# Patient Record
Sex: Female | Born: 1980 | Race: White | Hispanic: No | Marital: Married | Smoking: Current every day smoker
Health system: Southern US, Community
[De-identification: ages and names within clinical notes are randomized; demographics above are authoritative.]

## PROBLEM LIST (undated history)

## (undated) DIAGNOSIS — F32A Depression, unspecified: Secondary | ICD-10-CM

## (undated) DIAGNOSIS — F988 Other specified behavioral and emotional disorders with onset usually occurring in childhood and adolescence: Secondary | ICD-10-CM

## (undated) DIAGNOSIS — F329 Major depressive disorder, single episode, unspecified: Secondary | ICD-10-CM

## (undated) DIAGNOSIS — K5790 Diverticulosis of intestine, part unspecified, without perforation or abscess without bleeding: Secondary | ICD-10-CM

## (undated) DIAGNOSIS — N803 Endometriosis of pelvic peritoneum, unspecified: Secondary | ICD-10-CM

## (undated) DIAGNOSIS — M1990 Unspecified osteoarthritis, unspecified site: Secondary | ICD-10-CM

## (undated) DIAGNOSIS — F419 Anxiety disorder, unspecified: Secondary | ICD-10-CM

## (undated) HISTORY — PX: ECTOPIC PREGNANCY SURGERY: SHX613

## (undated) HISTORY — DX: Depression, unspecified: F32.A

## (undated) HISTORY — DX: Anxiety disorder, unspecified: F41.9

## (undated) HISTORY — PX: ABDOMINAL HYSTERECTOMY: SHX81

## (undated) HISTORY — DX: Unspecified osteoarthritis, unspecified site: M19.90

## (undated) HISTORY — PX: URETHRAL SLING: SHX2621

## (undated) HISTORY — DX: Major depressive disorder, single episode, unspecified: F32.9

## (undated) HISTORY — DX: Diverticulosis of intestine, part unspecified, without perforation or abscess without bleeding: K57.90

---

## 2012-01-05 ENCOUNTER — Encounter (HOSPITAL_COMMUNITY): Payer: Self-pay | Admitting: Emergency Medicine

## 2012-01-05 ENCOUNTER — Emergency Department (HOSPITAL_COMMUNITY)
Admission: EM | Admit: 2012-01-05 | Discharge: 2012-01-06 | Disposition: A | Discharge status: Home / Self Care | Payer: PRIVATE HEALTH INSURANCE | Attending: Emergency Medicine | Admitting: Emergency Medicine

## 2012-01-05 DIAGNOSIS — G8918 Other acute postprocedural pain: Secondary | ICD-10-CM | POA: Insufficient documentation

## 2012-01-05 DIAGNOSIS — E119 Type 2 diabetes mellitus without complications: Secondary | ICD-10-CM | POA: Insufficient documentation

## 2012-01-05 DIAGNOSIS — N390 Urinary tract infection, site not specified: Secondary | ICD-10-CM

## 2012-01-05 DIAGNOSIS — R109 Unspecified abdominal pain: Secondary | ICD-10-CM | POA: Insufficient documentation

## 2012-01-05 DIAGNOSIS — F17200 Nicotine dependence, unspecified, uncomplicated: Secondary | ICD-10-CM | POA: Insufficient documentation

## 2012-01-05 DIAGNOSIS — Z79899 Other long term (current) drug therapy: Secondary | ICD-10-CM | POA: Insufficient documentation

## 2012-01-05 LAB — CBC WITH DIFFERENTIAL/PLATELET
Basophils Absolute: 0 10*3/uL (ref 0.0–0.1)
Basophils Relative: 0 % (ref 0–1)
Eosinophils Absolute: 1.1 10*3/uL — ABNORMAL HIGH (ref 0.0–0.7)
Hemoglobin: 12 g/dL (ref 12.0–15.0)
MCHC: 34.8 g/dL (ref 30.0–36.0)
Neutro Abs: 5.5 10*3/uL (ref 1.7–7.7)
Neutrophils Relative %: 62 % (ref 43–77)
Platelets: 303 10*3/uL (ref 150–400)
RDW: 12.4 % (ref 11.5–15.5)

## 2012-01-05 LAB — URINALYSIS, ROUTINE W REFLEX MICROSCOPIC
Ketones, ur: 15 mg/dL — AB
Nitrite: NEGATIVE
Protein, ur: NEGATIVE mg/dL
Urobilinogen, UA: 1 mg/dL (ref 0.0–1.0)
pH: 6 (ref 5.0–8.0)

## 2012-01-05 LAB — COMPREHENSIVE METABOLIC PANEL
AST: 9 U/L (ref 0–37)
Albumin: 3.4 g/dL — ABNORMAL LOW (ref 3.5–5.2)
Alkaline Phosphatase: 51 U/L (ref 39–117)
Chloride: 106 mEq/L (ref 96–112)
Potassium: 3.9 mEq/L (ref 3.5–5.1)
Total Bilirubin: 0.2 mg/dL — ABNORMAL LOW (ref 0.3–1.2)
Total Protein: 7.4 g/dL (ref 6.0–8.3)

## 2012-01-05 LAB — URINE MICROSCOPIC-ADD ON

## 2012-01-05 NOTE — ED Notes (Addendum)
Pt reports having laparoscopy fallopian and endometrium tissue; last week had hysterectomy--reports for last 2-3 days has been getting worse; when eats having nausea/vomiting and reports bowel movements hurt as well; pain to RLQ--reports had f/u today and was told that she had fluid build up in abd or that it could be an infection; pt appears in pain at triage;too pain meds:  last dilaudid 1pm, ibuprofen about 2 hours ago; reports running fever on and off; pt reports swelling to abd

## 2012-01-06 ENCOUNTER — Emergency Department (HOSPITAL_COMMUNITY): Payer: PRIVATE HEALTH INSURANCE

## 2012-01-06 LAB — LIPASE, BLOOD: Lipase: 13 U/L (ref 11–59)

## 2012-01-06 MED ORDER — HYDROMORPHONE HCL PF 1 MG/ML IJ SOLN
1.0000 mg | Freq: Once | INTRAMUSCULAR | Status: AC
  Administered 2012-01-06: 1 mg via INTRAVENOUS
  Filled 2012-01-06: qty 1

## 2012-01-06 MED ORDER — METOCLOPRAMIDE HCL 5 MG/ML IJ SOLN
10.0000 mg | Freq: Once | INTRAMUSCULAR | Status: AC
  Administered 2012-01-06: 10 mg via INTRAVENOUS
  Filled 2012-01-06: qty 2

## 2012-01-06 MED ORDER — IOHEXOL 300 MG/ML  SOLN
100.0000 mL | Freq: Once | INTRAMUSCULAR | Status: AC | PRN
  Administered 2012-01-06: 100 mL via INTRAVENOUS

## 2012-01-06 MED ORDER — SUMATRIPTAN SUCCINATE 6 MG/0.5ML ~~LOC~~ SOLN
6.0000 mg | Freq: Once | SUBCUTANEOUS | Status: AC
  Administered 2012-01-06: 6 mg via SUBCUTANEOUS
  Filled 2012-01-06: qty 0.5

## 2012-01-06 MED ORDER — IOHEXOL 300 MG/ML  SOLN
20.0000 mL | INTRAMUSCULAR | Status: AC
  Administered 2012-01-06: 20 mL via ORAL

## 2012-01-06 MED ORDER — SODIUM CHLORIDE 0.9 % IV BOLUS (SEPSIS)
1000.0000 mL | Freq: Once | INTRAVENOUS | Status: AC
  Administered 2012-01-06: 1000 mL via INTRAVENOUS

## 2012-01-06 MED ORDER — DEXTROSE 5 % IV SOLN
1.0000 g | Freq: Once | INTRAVENOUS | Status: AC
  Administered 2012-01-06: 1 g via INTRAVENOUS
  Filled 2012-01-06: qty 10

## 2012-01-06 MED ORDER — ONDANSETRON HCL 4 MG/2ML IJ SOLN
4.0000 mg | INTRAMUSCULAR | Status: DC | PRN
  Administered 2012-01-06: 4 mg via INTRAVENOUS
  Filled 2012-01-06: qty 2

## 2012-01-06 MED ORDER — KETOROLAC TROMETHAMINE 30 MG/ML IJ SOLN
30.0000 mg | Freq: Once | INTRAMUSCULAR | Status: AC
  Administered 2012-01-06: 30 mg via INTRAVENOUS
  Filled 2012-01-06: qty 1

## 2012-01-06 NOTE — ED Provider Notes (Signed)
History     CSN: [id number]  Arrival date & time 01/05/12  2231   First MD Initiated Contact with Patient 01/06/12 0037      Chief Complaint  Patient presents with  . Abdominal Pain    (Consider location/radiation/quality/duration/timing/severity/associated sxs/prior treatment) HPI Comments: Patient is a 31 year old female with a history of diabetes that presents to the emergency department with severe diffuse abdominal pain status post recent surgery.  3 weeks ago pt had a laparoscopy removing endometrial adhesions, pt was placed on cipro at dc, but has not taken it as directed. Abdominal hysterectomy was performed one week ago in [place] and patient was discharged on Dilaudid and ibuprofen which was relieving her pain until Sat. Patient states that she had her followup appointment today and that the sonographer mentioned she had fluid in her abdomen that might be concerning for infection.  Patient was then dc on Bactrim which was filled, but has not started taking.  Patient has also been suffering from nausea, vomiting, and diarrhea x3 days.  Abdominal pain is rated at 10 out of 10 in this severity without any focal localization. Symptoms are exacerbated by eating. She is unsure she's been febrile, however patient does report chills and night sweats.  Patient denies dysuria, hematuria, urinary frequency, melena, hematochezia, hematemesis, chest pain, pleurisy or shortness of breath.  No other complaints at this time.  Patient is a 31 y.o. female presenting with abdominal pain. The history is provided by the patient.  Abdominal Pain The primary symptoms of the illness include abdominal pain.    Past Medical History  Diagnosis Date  . Diabetes mellitus     borderline    Past Surgical History  Procedure Date  . Cesarean section 1998  . Abdominal hysterectomy   . Ectopic pregnancy surgery     No family history on file.  History  Substance Use Topics  . Smoking status:  Current Everyday Smoker -- 0.5 packs/day  . Smokeless tobacco: Not on file  . Alcohol Use: No    OB History    Grav Para Term Preterm Abortions TAB SAB Ect Mult Living                  Review of Systems  Gastrointestinal: Positive for abdominal pain.  All other systems reviewed and are negative.    Allergies  Sweet potato  Home Medications   Current Outpatient Rx  Name Route Sig Dispense Refill  . CIPROFLOXACIN HCL 500 MG PO TABS Oral Take 500 mg by mouth 2 (two) times daily.    Marland Kitchen CLONAZEPAM 0.5 MG PO TABS Oral Take 0.5 mg by mouth 2 (two) times daily as needed.    Marland Kitchen HYDROMORPHONE HCL 2 MG PO TABS Oral Take 2 mg by mouth every 4 (four) hours as needed. For pain    . IBUPROFEN 600 MG PO TABS Oral Take 600 mg by mouth every 6 (six) hours as needed. For pain    . METFORMIN HCL 500 MG PO TABS Oral Take 500 mg by mouth 2 (two) times daily with a meal.      BP 132/92  Pulse 80  Temp 98.5 F (36.9 C) (Oral)  Resp 19  Ht 5\' 6"  (1.676 m)  Wt 148 lb (67.132 kg)  BMI 23.89 kg/m2  SpO2 98%  Physical Exam  Nursing note and vitals reviewed. Constitutional: She is oriented to person, place, and time. She appears well-developed and well-nourished. No distress.  HENT:  Head: Normocephalic and atraumatic.  Eyes: Conjunctivae and EOM are normal.  Neck: Normal range of motion.  Cardiovascular:       Regular rate rhythm, no aberrancy and auscultation.  Intact distal pulses  Pulmonary/Chest: Effort normal.       Lungs clear to auscultation bilaterally  Abdominal:       Soft nondistended abdomen without peritoneal signs.  Diffuse abdominal tenderness to palpation.  Musculoskeletal: Normal range of motion.  Neurological: She is alert and oriented to person, place, and time.  Skin: Skin is warm and dry. No rash noted. She is not diaphoretic.  Psychiatric: She has a normal mood and affect. Her behavior is normal.    ED Course  Procedures (including critical care time)  Labs  Reviewed  URINALYSIS, ROUTINE W REFLEX MICROSCOPIC - Abnormal; Notable for the following:    APPearance TURBID (*)     Specific Gravity, Urine 1.034 (*)     Hgb urine dipstick LARGE (*)     Bilirubin Urine SMALL (*)     Ketones, ur 15 (*)     Leukocytes, UA MODERATE (*)     All other components within normal limits  CBC WITH DIFFERENTIAL - Abnormal; Notable for the following:    HCT 34.5 (*)     Eosinophils Relative 12 (*)     Eosinophils Absolute 1.1 (*)     All other components within normal limits  COMPREHENSIVE METABOLIC PANEL - Abnormal; Notable for the following:    Albumin 3.4 (*)     Total Bilirubin 0.2 (*)     All other components within normal limits  URINE MICROSCOPIC-ADD ON - Abnormal; Notable for the following:    Squamous Epithelial / LPF MANY (*)     Bacteria, UA MANY (*)     All other components within normal limits  LIPASE, BLOOD   Ct Abdomen Pelvis W Contrast  01/06/2012  *RADIOLOGY REPORT*  Clinical Data: Lower abdominal pain and fever.  CT ABDOMEN AND PELVIS WITH CONTRAST  Technique:  Multidetector CT imaging of the abdomen and pelvis was performed following the standard protocol during bolus administration of intravenous contrast.  Contrast: OMNIPAQUE IOHEXOL 300 MG/ML  SOLN  Comparison: None.  Findings: Dependent atelectasis at the lung bases.  The liver, gallbladder, spleen, pancreas, common bile duct and adrenal glands appear within normal limits.  Gallbladder is distended.  There is no intra-abdominal free air identified.  The stomach appears within normal limits.  Inflammatory changes are present in the anatomic pelvis compatible with recent hysterectomy.  Phlegmon is present without a discrete defined fluid collection.  Urinary bladder appears within normal limits.  Fluid is present in the pouch of [place].  Fluid surrounds mildly inflamed small bowel loops in the pelvis.  There is no extravasation of contrast from bowel.  Normal appendix identified.  Gaseous  distention of the rectum.  There is no bowel obstruction.  No aggressive osseous lesions.  IMPRESSION: Postoperative changes of recent hysterectomy with phlegmon in the anatomic pelvis.  No discrete fluid collection is identified.  No small bowel obstruction.  No intra-abdominal free air.  Original Report Authenticated By: Andreas Newport, M.D.     No diagnosis found.    MDM  Post surgical abdominal pain  Pt presented to ER w post surgical abdominal pain. Labs and imaging reviewed. Urine ? For not clean catch vs UTI. Pt started on Bactrim by provider which should cover. Advised to follow up with PCP if develop urinary symptoms and to keep f-u apt with surgeon.  At this time there does not appear to be any evidence of an acute emergency medical condition and the patient appears stable for discharge with appropriate outpatient follow up        [facility], PA-C 01/06/12 0425

## 2012-01-06 NOTE — ED Provider Notes (Signed)
Medical screening examination/treatment/procedure(s) were performed by non-physician practitioner and as supervising physician I was immediately available for consultation/collaboration.   Millard Bautch M Vicke Plotner, MD 01/06/12 1710 

## 2012-01-06 NOTE — ED Notes (Signed)
Patient transported to CT 

## 2012-12-01 ENCOUNTER — Encounter (HOSPITAL_COMMUNITY): Payer: Self-pay | Admitting: Emergency Medicine

## 2012-12-01 ENCOUNTER — Emergency Department (HOSPITAL_COMMUNITY)
Admission: EM | Admit: 2012-12-01 | Discharge: 2012-12-02 | Disposition: A | Discharge status: Home / Self Care | Payer: PRIVATE HEALTH INSURANCE | Attending: Emergency Medicine | Admitting: Emergency Medicine

## 2012-12-01 DIAGNOSIS — G8929 Other chronic pain: Secondary | ICD-10-CM | POA: Insufficient documentation

## 2012-12-01 DIAGNOSIS — N804 Endometriosis of rectovaginal septum and vagina: Secondary | ICD-10-CM | POA: Insufficient documentation

## 2012-12-01 DIAGNOSIS — N949 Unspecified condition associated with female genital organs and menstrual cycle: Secondary | ICD-10-CM | POA: Insufficient documentation

## 2012-12-01 DIAGNOSIS — F17200 Nicotine dependence, unspecified, uncomplicated: Secondary | ICD-10-CM | POA: Insufficient documentation

## 2012-12-01 DIAGNOSIS — R102 Pelvic and perineal pain: Secondary | ICD-10-CM

## 2012-12-01 DIAGNOSIS — N809 Endometriosis, unspecified: Secondary | ICD-10-CM

## 2012-12-01 DIAGNOSIS — M545 Low back pain, unspecified: Secondary | ICD-10-CM | POA: Insufficient documentation

## 2012-12-01 DIAGNOSIS — Z90710 Acquired absence of both cervix and uterus: Secondary | ICD-10-CM | POA: Insufficient documentation

## 2012-12-01 DIAGNOSIS — E119 Type 2 diabetes mellitus without complications: Secondary | ICD-10-CM | POA: Insufficient documentation

## 2012-12-01 DIAGNOSIS — R32 Unspecified urinary incontinence: Secondary | ICD-10-CM | POA: Insufficient documentation

## 2012-12-01 DIAGNOSIS — N8042 Endometriosis of rectovaginal septum with involvement of vagina: Secondary | ICD-10-CM | POA: Insufficient documentation

## 2012-12-01 DIAGNOSIS — Z9889 Other specified postprocedural states: Secondary | ICD-10-CM | POA: Insufficient documentation

## 2012-12-01 DIAGNOSIS — Z79899 Other long term (current) drug therapy: Secondary | ICD-10-CM | POA: Insufficient documentation

## 2012-12-01 HISTORY — DX: Endometriosis of pelvic peritoneum, unspecified: N80.30

## 2012-12-01 HISTORY — DX: Endometriosis of pelvic peritoneum: N80.3

## 2012-12-01 LAB — URINALYSIS, ROUTINE W REFLEX MICROSCOPIC
Bilirubin Urine: NEGATIVE
Nitrite: NEGATIVE
Protein, ur: NEGATIVE mg/dL
Specific Gravity, Urine: >1.03 — ABNORMAL HIGH (ref 1.005–1.030)
Urobilinogen, UA: 0.2 mg/dL (ref 0.0–1.0)

## 2012-12-01 MED ORDER — FENTANYL CITRATE 0.05 MG/ML IJ SOLN
50.0000 ug | Freq: Once | INTRAMUSCULAR | Status: AC
  Administered 2012-12-02: 50 ug via INTRAVENOUS
  Filled 2012-12-01: qty 2

## 2012-12-01 MED ORDER — ONDANSETRON 4 MG PO TBDP
4.0000 mg | ORAL_TABLET | Freq: Once | ORAL | Status: AC
  Administered 2012-12-01: 4 mg via ORAL
  Filled 2012-12-01: qty 1

## 2012-12-01 NOTE — ED Notes (Signed)
Severe lower back pain, abdominal cramping progressively worse.  Nausea and vomiting.   States had small bloody discharge from vagina a couple of days ago.

## 2012-12-02 ENCOUNTER — Emergency Department (HOSPITAL_COMMUNITY): Payer: PRIVATE HEALTH INSURANCE

## 2012-12-02 LAB — COMPREHENSIVE METABOLIC PANEL
ALT: 7 U/L (ref 0–35)
AST: 12 U/L (ref 0–37)
Albumin: 4.1 g/dL (ref 3.5–5.2)
Alkaline Phosphatase: 41 U/L (ref 39–117)
Calcium: 9.1 mg/dL (ref 8.4–10.5)
GFR calc Af Amer: >90 mL/min (ref >90)
Glucose, Bld: 99 mg/dL (ref 70–99)
Potassium: 3.8 mEq/L (ref 3.5–5.1)
Sodium: 139 mEq/L (ref 135–145)
Total Protein: 6.8 g/dL (ref 6.0–8.3)

## 2012-12-02 LAB — CBC WITH DIFFERENTIAL/PLATELET
Basophils Absolute: 0 10*3/uL (ref 0.0–0.1)
Basophils Relative: 0 % (ref 0–1)
Eosinophils Absolute: 0.6 10*3/uL (ref 0.0–0.7)
Eosinophils Relative: 8 % — ABNORMAL HIGH (ref 0–5)
Lymphs Abs: 2.8 10*3/uL (ref 0.7–4.0)
MCH: 31.1 pg (ref 26.0–34.0)
MCV: 88.4 fL (ref 78.0–100.0)
Neutrophils Relative %: 45 % (ref 43–77)
Platelets: 175 10*3/uL (ref 150–400)
RBC: 4.41 MIL/uL (ref 3.87–5.11)
RDW: 12.5 % (ref 11.5–15.5)

## 2012-12-02 MED ORDER — IOHEXOL 300 MG/ML  SOLN
100.0000 mL | Freq: Once | INTRAMUSCULAR | Status: AC | PRN
  Administered 2012-12-02: 100 mL via INTRAVENOUS

## 2012-12-02 MED ORDER — SODIUM CHLORIDE 0.9 % IV SOLN
1000.0000 mL | Freq: Once | INTRAVENOUS | Status: AC
  Administered 2012-12-02: 1000 mL via INTRAVENOUS

## 2012-12-02 MED ORDER — ONDANSETRON HCL 4 MG/2ML IJ SOLN
4.0000 mg | Freq: Once | INTRAMUSCULAR | Status: AC
  Administered 2012-12-02: 4 mg via INTRAVENOUS
  Filled 2012-12-02: qty 2

## 2012-12-02 MED ORDER — METOCLOPRAMIDE HCL 5 MG/ML IJ SOLN
10.0000 mg | Freq: Once | INTRAMUSCULAR | Status: AC
  Administered 2012-12-02: 10 mg via INTRAVENOUS
  Filled 2012-12-02: qty 2

## 2012-12-02 MED ORDER — MORPHINE SULFATE 2 MG/ML IJ SOLN
4.0000 mg | Freq: Once | INTRAMUSCULAR | Status: AC
  Administered 2012-12-02: 4 mg via INTRAVENOUS
  Filled 2012-12-02: qty 2

## 2012-12-02 MED ORDER — HYDROCODONE-ACETAMINOPHEN 5-325 MG PO TABS: ORAL_TABLET | ORAL | Status: DC

## 2012-12-02 MED ORDER — IOHEXOL 300 MG/ML  SOLN
50.0000 mL | Freq: Once | INTRAMUSCULAR | Status: AC | PRN
  Administered 2012-12-02: 50 mL via ORAL

## 2012-12-02 MED ORDER — SODIUM CHLORIDE 0.9 % IV SOLN
1000.0000 mL | INTRAVENOUS | Status: DC
  Administered 2012-12-02: 1000 mL via INTRAVENOUS

## 2012-12-02 MED ORDER — MORPHINE SULFATE 4 MG/ML IJ SOLN
4.0000 mg | Freq: Once | INTRAMUSCULAR | Status: AC
  Administered 2012-12-02: 4 mg via INTRAVENOUS
  Filled 2012-12-02: qty 1

## 2012-12-02 MED ORDER — DIPHENHYDRAMINE HCL 50 MG/ML IJ SOLN
25.0000 mg | Freq: Once | INTRAMUSCULAR | Status: AC
  Administered 2012-12-02: 25 mg via INTRAVENOUS
  Filled 2012-12-02: qty 1

## 2012-12-02 NOTE — ED Notes (Signed)
Patient tearful, states she is so worried because she doesn't know why she is having so much pain.  Spouse sitting with patient

## 2012-12-02 NOTE — ED Notes (Addendum)
Drinking contrast - has completed both bottles.  Requesting something else for pain and asking if she can have phenergan - states zofran has not helped

## 2012-12-02 NOTE — ED Provider Notes (Signed)
History    CSN: [id number] Arrival date & time 12/01/12  2208  First MD Initiated Contact with Patient 12/02/12 0004     Chief Complaint  Patient presents with  . Abdominal Pain   (Consider location/radiation/quality/duration/timing/severity/associated sxs/prior Treatment)  HPI  Patient reports she had a hysterectomy in August 2013 because the heavy bleeding and endometriosis that was diagnosed by laparoscopy. She reports she's been having chronic pain since that time. She also has chronic low back pain. She states she had a postop infection and that is what started her chronic pain. She states now she is having pain in her vagina and she has urinary incontinence especially if she coughs or lifts up something heavy that started about 2 weeks ago. She denies dysuria but states she feels a constant pressure over her bladder. She also describes a stabbing pain in her lower abdomen. She states she has dyspareunia so bad that she vomits from pain after sex. She states she's had a low-grade fever for the past week of 100. She states she has an appointment with her GYN at [facility] in about 2 weeks. She has this appointment because she called because her symptoms were getting worse. She also reports they put her on Depo-Provera in January however it made her migraines worse so her neurologist had her stop taking it.    PCP Dr Bubba Hales in [place], [place] GYN Dr Renard Matter at [facility]  Past Medical History  Diagnosis Date  . Diabetes mellitus     borderline  . Endometriosis of pelvic peritoneum    Past Surgical History  Procedure Laterality Date  . Cesarean section  1998  . Abdominal hysterectomy    . Ectopic pregnancy surgery     No family history on file. History  Substance Use Topics  . Smoking status: Current Every Day Smoker -- 0.50 packs/day  . Smokeless tobacco: Not on file  . 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    Allergies  Sweet potato  Home Medications   Current Outpatient Rx  Name  Route  Sig  Dispense  Refill  . clonazePAM (KLONOPIN) 0.5 MG tablet   Oral   Take 0.5 mg by mouth 2 (two) times daily as needed.         Marland Kitchen ibuprofen (ADVIL,MOTRIN) 600 MG tablet   Oral   Take 600 mg by mouth every 6 (six) hours as needed. For pain         . metFORMIN (GLUCOPHAGE) 500 MG tablet   Oral   Take 500 mg by mouth 2 (two) times daily with a meal.         . ciprofloxacin (CIPRO) 500 MG tablet   Oral   Take 500 mg by mouth 2 (two) times daily.         Marland Kitchen HYDROmorphone (DILAUDID) 2 MG tablet   Oral   Take 2 mg by mouth every 4 (four) hours as needed. For pain          BP 111/78  Pulse 73  Temp(Src) 98.7 F (37.1 C) (Oral)  Resp 18  Ht 5\' 6"  (1.676 m)  Wt 130 lb (58.968 kg)  BMI 20.99 kg/m2  SpO2 98%  Vital signs normal  Physical Exam  Nursing note and vitals reviewed. Constitutional: She is oriented to person, place, and time. She appears well-developed and well-nourished.  Non-toxic appearance. She does not appear ill. No distress.  HENT:  Head: Normocephalic and atraumatic.  Right Ear: External ear normal.  Left Ear: External ear normal.  Nose: Nose normal. No mucosal edema or rhinorrhea.  Mouth/Throat: Oropharynx is clear and moist and mucous membranes are normal. No dental abscesses or edematous.  Eyes: Conjunctivae and EOM are normal. Pupils are equal, round, and reactive to light.  Neck: Normal range of motion and full passive range of motion without pain. Neck supple.  Cardiovascular: Normal rate, regular rhythm and normal heart sounds.  Exam reveals no gallop and no friction rub.   No murmur heard. Pulmonary/Chest: Effort normal and breath sounds normal. No respiratory distress. She has no wheezes. She has no rhonchi. She has no rales. She exhibits no tenderness  and no crepitus.  Abdominal: Soft. Normal appearance and bowel sounds are normal. She exhibits no distension. There is tenderness. There is no rebound and no guarding.    Musculoskeletal: Normal range of motion. She exhibits no edema and no tenderness.  Moves all extremities well.   Neurological: She is alert and oriented to person, place, and time. She has normal strength. No cranial nerve deficit.  Skin: Skin is warm, dry and intact. No rash noted. No erythema. No pallor.  Psychiatric: She has a normal mood and affect. Her speech is normal and behavior is normal. Her mood appears not anxious.    ED Course  Procedures (including critical care time)  Medications  0.9 %  sodium chloride infusion (0 mLs Intravenous Stopped 12/02/12 0256)    Followed by  0.9 %  sodium chloride infusion (1,000 mLs Intravenous New Bag/Given 12/02/12 0306)  ondansetron (ZOFRAN-ODT) disintegrating tablet 4 mg (4 mg Oral Given 12/01/12 2317)  fentaNYL (SUBLIMAZE) injection 50 mcg (50 mcg Intravenous Given 12/02/12 0010)  morphine 4 MG/ML injection 4 mg (4 mg Intravenous Given 12/02/12 0120)  ondansetron (ZOFRAN) injection 4 mg (4 mg Intravenous Given 12/02/12 0117)  iohexol (OMNIPAQUE) 300 MG/ML solution 50 mL (50 mLs Oral Contrast Given 12/02/12 0235)  iohexol (OMNIPAQUE) 300 MG/ML solution 100 mL (100 mLs Intravenous Contrast Given 12/02/12 0336)  metoCLOPramide (REGLAN) injection 10 mg (10 mg Intravenous Given 12/02/12 0306)  diphenhydrAMINE (BENADRYL) injection 25 mg (25 mg Intravenous Given 12/02/12 0309)  morphine 4 MG/ML injection 4 mg (4 mg Intravenous Given 12/02/12 0310)  morphine 2 MG/ML injection 4 mg (4 mg Intravenous Given 12/02/12 0536)   We discussed her CT results. Pt has an appt in 2 weeks at [facility] for same. Will give copy of her CT scan to take with her.   I searched the [facility] and [place] narcotic data base and she has no entry in either.   Results for orders placed during the hospital encounter of 12/01/12    URINALYSIS, ROUTINE W REFLEX MICROSCOPIC      Result Value Range   Color, Urine YELLOW  YELLOW   APPearance CLEAR  CLEAR   Specific Gravity, Urine >1.030 (*) 1.005 - 1.030   pH 6.0  5.0 - 8.0   Glucose, UA NEGATIVE  NEGATIVE mg/dL   Hgb urine dipstick NEGATIVE  NEGATIVE   Bilirubin Urine NEGATIVE  NEGATIVE   Ketones, ur TRACE (*) NEGATIVE mg/dL   Protein, ur NEGATIVE  NEGATIVE mg/dL   Urobilinogen, UA 0.2  0.0 - 1.0 mg/dL   Nitrite NEGATIVE  NEGATIVE  Leukocytes, UA NEGATIVE  NEGATIVE  CBC WITH DIFFERENTIAL      Result Value Range   WBC 6.8  4.0 - 10.5 K/uL   RBC 4.41  3.87 - 5.11 MIL/uL   Hemoglobin 13.7  12.0 - 15.0 g/dL   HCT 16.1  09.6 - 04.5 %   MCV 88.4  78.0 - 100.0 fL   MCH 31.1  26.0 - 34.0 pg   MCHC 35.1  30.0 - 36.0 g/dL   RDW 40.9  81.1 - 91.4 %   Platelets 175  150 - 400 K/uL   Neutrophils Relative % 45  43 - 77 %   Neutro Abs 3.0  1.7 - 7.7 K/uL   Lymphocytes Relative 41  12 - 46 %   Lymphs Abs 2.8  0.7 - 4.0 K/uL   Monocytes Relative 6  3 - 12 %   Monocytes Absolute 0.4  0.1 - 1.0 K/uL   Eosinophils Relative 8 (*) 0 - 5 %   Eosinophils Absolute 0.6  0.0 - 0.7 K/uL   Basophils Relative 0  0 - 1 %   Basophils Absolute 0.0  0.0 - 0.1 K/uL  COMPREHENSIVE METABOLIC PANEL      Result Value Range   Sodium 139  135 - 145 mEq/L   Potassium 3.8  3.5 - 5.1 mEq/L   Chloride 106  96 - 112 mEq/L   CO2 26  19 - 32 mEq/L   Glucose, Bld 99  70 - 99 mg/dL   BUN 14  6 - 23 mg/dL   Creatinine, Ser 7.82  0.50 - 1.10 mg/dL   Calcium 9.1  8.4 - 95.6 mg/dL   Total Protein 6.8  6.0 - 8.3 g/dL   Albumin 4.1  3.5 - 5.2 g/dL   AST 12  0 - 37 U/L   ALT 7  0 - 35 U/L   Alkaline Phosphatase 41  39 - 117 U/L   Total Bilirubin 0.3  0.3 - 1.2 mg/dL   GFR calc non Af Amer >90  >90 mL/min   GFR calc Af Amer >90  >90 mL/min  LIPASE, BLOOD      Result Value Range   Lipase 31  11 - 59 U/L   Ct Abdomen Pelvis W Contrast  12/02/2012   *RADIOLOGY REPORT*  Clinical Data: Lower  abdominal pain since a hysterectomy in August. Now with worsening pain and fever.  CT ABDOMEN AND PELVIS WITH CONTRAST  Technique:  Multidetector CT imaging of the abdomen and pelvis was performed following the standard protocol during bolus administration of intravenous contrast.  Contrast: 50mL OMNIPAQUE IOHEXOL 300 MG/ML  SOLN, OMNIPAQUE IOHEXOL 300 MG/ML  SOLN  Comparison: 01/06/2012  Findings: Mild dependent atelectasis in the lung bases.  The liver, spleen, gallbladder, pancreas, adrenal glands, kidneys, abdominal aorta, and retroperitoneal lymph nodes are unremarkable. The stomach and small bowel are not abnormally distended.  No evidence of wall thickening.  Contrast material flows to the colon without evidence of obstruction.  Stool filled colon without distension.  No free air or free fluid in the abdomen.  Abdominal wall musculature appears intact.  Pelvis:  Previously demonstrated inflammatory changes in the pelvis have resolved.  No significant free or loculated fluid collection demonstrated.  The uterus is surgically absent. Mild soft tissue prominence in the low pelvis is likely postoperative scarring. Right ovary is visualized and is not abnormally enlarged.  No abnormal adnexal mass on the left.  No evidence of diverticulitis. No significant  lymphadenopathy in the pelvis.  Normal alignment of the lumbar spine.  IMPRESSION: No acute inflammatory process demonstrated in the abdomen or pelvis.  Previous inflammatory changes in the pelvis have resolved.   Original Report Authenticated By: Burman Nieves, M.D.      1. Chronic pelvic pain in female   2. Endometriosis   3. Urinary incontinence     New Prescriptions   HYDROCODONE-ACETAMINOPHEN (NORCO/VICODIN) 5-325 MG PER TABLET    Take 1 or 2 po Q 6hrs for pain    Plan discharge   Devoria Albe, MD, FACEP   MDM    Ward Givens, MD 12/02/12 [phone]

## 2013-12-06 ENCOUNTER — Emergency Department (HOSPITAL_COMMUNITY)
Admission: EM | Admit: 2013-12-06 | Discharge: 2013-12-06 | Disposition: A | Discharge status: Home / Self Care | Payer: PRIVATE HEALTH INSURANCE | Attending: Emergency Medicine | Admitting: Emergency Medicine

## 2013-12-06 ENCOUNTER — Emergency Department (HOSPITAL_COMMUNITY): Payer: PRIVATE HEALTH INSURANCE

## 2013-12-06 ENCOUNTER — Encounter (HOSPITAL_COMMUNITY): Payer: Self-pay | Admitting: Emergency Medicine

## 2013-12-06 DIAGNOSIS — E119 Type 2 diabetes mellitus without complications: Secondary | ICD-10-CM | POA: Insufficient documentation

## 2013-12-06 DIAGNOSIS — F17200 Nicotine dependence, unspecified, uncomplicated: Secondary | ICD-10-CM | POA: Insufficient documentation

## 2013-12-06 DIAGNOSIS — Z8742 Personal history of other diseases of the female genital tract: Secondary | ICD-10-CM | POA: Insufficient documentation

## 2013-12-06 DIAGNOSIS — Z79899 Other long term (current) drug therapy: Secondary | ICD-10-CM | POA: Insufficient documentation

## 2013-12-06 DIAGNOSIS — R5383 Other fatigue: Secondary | ICD-10-CM

## 2013-12-06 DIAGNOSIS — R5381 Other malaise: Secondary | ICD-10-CM | POA: Insufficient documentation

## 2013-12-06 DIAGNOSIS — G43809 Other migraine, not intractable, without status migrainosus: Secondary | ICD-10-CM

## 2013-12-06 LAB — COMPREHENSIVE METABOLIC PANEL
ALT: 7 U/L (ref 0–35)
AST: 13 U/L (ref 0–37)
Albumin: 4.1 g/dL (ref 3.5–5.2)
Alkaline Phosphatase: 41 U/L (ref 39–117)
Anion gap: 14 (ref 5–15)
BUN: 8 mg/dL (ref 6–23)
CHLORIDE: 103 meq/L (ref 96–112)
CO2: 23 meq/L (ref 19–32)
CREATININE: 0.65 mg/dL (ref 0.50–1.10)
Calcium: 9.1 mg/dL (ref 8.4–10.5)
GLUCOSE: 107 mg/dL — AB (ref 70–99)
Potassium: 4.2 mEq/L (ref 3.7–5.3)
Sodium: 140 mEq/L (ref 137–147)
Total Protein: 7.1 g/dL (ref 6.0–8.3)

## 2013-12-06 LAB — CBC WITH DIFFERENTIAL/PLATELET
BASOS ABS: 0 10*3/uL (ref 0.0–0.1)
Basophils Relative: 0 % (ref 0–1)
Eosinophils Absolute: 0.2 10*3/uL (ref 0.0–0.7)
Eosinophils Relative: 3 % (ref 0–5)
HEMATOCRIT: 39.3 % (ref 36.0–46.0)
HEMOGLOBIN: 13.5 g/dL (ref 12.0–15.0)
LYMPHS ABS: 1 10*3/uL (ref 0.7–4.0)
LYMPHS PCT: 13 % (ref 12–46)
MCH: 31 pg (ref 26.0–34.0)
MCHC: 34.4 g/dL (ref 30.0–36.0)
MCV: 90.1 fL (ref 78.0–100.0)
MONO ABS: 0.1 10*3/uL (ref 0.1–1.0)
MONOS PCT: 2 % — AB (ref 3–12)
NEUTROS ABS: 6.9 10*3/uL (ref 1.7–7.7)
Neutrophils Relative %: 82 % — ABNORMAL HIGH (ref 43–77)
Platelets: 208 10*3/uL (ref 150–400)
RBC: 4.36 MIL/uL (ref 3.87–5.11)
RDW: 12.7 % (ref 11.5–15.5)
WBC: 8.3 10*3/uL (ref 4.0–10.5)

## 2013-12-06 MED ORDER — KETOROLAC TROMETHAMINE 30 MG/ML IJ SOLN
30.0000 mg | Freq: Once | INTRAMUSCULAR | Status: AC
  Administered 2013-12-06: 30 mg via INTRAVENOUS
  Filled 2013-12-06: qty 1

## 2013-12-06 MED ORDER — SODIUM CHLORIDE 0.9 % IV BOLUS (SEPSIS)
1000.0000 mL | Freq: Once | INTRAVENOUS | Status: AC
  Administered 2013-12-06: 1000 mL via INTRAVENOUS

## 2013-12-06 MED ORDER — DIPHENHYDRAMINE HCL 50 MG/ML IJ SOLN
25.0000 mg | Freq: Once | INTRAMUSCULAR | Status: AC
  Administered 2013-12-06: 25 mg via INTRAVENOUS
  Filled 2013-12-06: qty 1

## 2013-12-06 MED ORDER — DEXAMETHASONE SODIUM PHOSPHATE 10 MG/ML IJ SOLN
10.0000 mg | Freq: Once | INTRAMUSCULAR | Status: AC
  Administered 2013-12-06: 10 mg via INTRAVENOUS
  Filled 2013-12-06: qty 1

## 2013-12-06 MED ORDER — METOCLOPRAMIDE HCL 5 MG/ML IJ SOLN
10.0000 mg | Freq: Once | INTRAMUSCULAR | Status: AC
  Administered 2013-12-06: 10 mg via INTRAVENOUS
  Filled 2013-12-06: qty 2

## 2013-12-06 NOTE — ED Notes (Signed)
Pt states that she fell approximately 3 weeks ago and hit her left shoulder and head.  Pt states that since she has been having period of dizziness, nausea and headaches.  Pt states this is her first time being checked for the first fall.  Pt states she has been bumping into things and has bruises on her legs.

## 2013-12-06 NOTE — ED Provider Notes (Signed)
CSN: [id number]     Arrival date & time 12/06/13  0118 History   First MD Initiated Contact with Patient 12/06/13 [phone]     Chief Complaint  Patient presents with  . Fall  . Headache     (Consider location/radiation/quality/duration/timing/severity/associated sxs/prior Treatment) HPI Comments: Patient is a 33 year old female with history of migraine headaches, type 2 diabetes. She presents with complaints of headache, unsteadiness, and generalized malaise for the past several days. She denies any visual disturbances. She denies any fevers, chills, or neck pain.  Patient is a 33 y.o. female presenting with fall and headaches. The history is provided by the patient.  Fall This is a recurrent problem. The problem occurs constantly. The problem has not changed since onset.Associated symptoms include headaches.  Headache   Past Medical History  Diagnosis Date  . Diabetes mellitus     borderline  . Endometriosis of pelvic peritoneum    Past Surgical History  Procedure Laterality Date  . Cesarean section  1998  . Abdominal hysterectomy    . Ectopic pregnancy surgery     No family history on file. History  Substance Use Topics  . Smoking status: Current Every Day Smoker -- 0.50 packs/day    Types: Cigarettes  . Smokeless tobacco: Not on file  . Alcohol Use: No   OB History   Grav Para Term Preterm Abortions TAB SAB Ect Mult Living                 Review of Systems  Neurological: Positive for headaches.  All other systems reviewed and are negative.     Allergies  Sweet potato  Home Medications   Prior to Admission medications   Medication Sig Start Date End Date Taking? Authorizing Provider  amphetamine-dextroamphetamine (ADDERALL) 30 MG tablet Take 30 mg by mouth 2 (two) times daily as needed (ADD).   Yes Historical Provider, MD  Aspirin-Salicylamide-Caffeine (BC HEADACHE POWDER PO) Take 1 packet by mouth 4 (four) times daily as needed (pain).   Yes Historical  Provider, MD  butalbital-acetaminophen-caffeine (FIORICET WITH CODEINE) 50-325-40-30 MG per capsule Take 1 capsule by mouth every 4 (four) hours as needed for headache or migraine.   Yes Historical Provider, MD  clonazePAM (KLONOPIN) 0.5 MG tablet Take 0.5 mg by mouth 2 (two) times daily as needed for anxiety.    Yes Historical Provider, MD  metFORMIN (GLUCOPHAGE) 500 MG tablet Take 1,000 mg by mouth at bedtime.    Yes Historical Provider, MD  naproxen sodium (ANAPROX) 220 MG tablet Take 660-880 mg by mouth 4 (four) times daily as needed (pain).   Yes Historical Provider, MD  nortriptyline (PAMELOR) 25 MG capsule Take 25 mg by mouth at bedtime.   Yes Historical Provider, MD  pramipexole (MIRAPEX) 0.125 MG tablet Take 0.25 mg by mouth at bedtime.   Yes Historical Provider, MD  rizatriptan (MAXALT-MLT) 10 MG disintegrating tablet Take 10 mg by mouth every 2 (two) hours as needed for migraine. May repeat in 2 hours if needed   Yes Historical Provider, MD  verapamil (CALAN-SR) 120 MG CR tablet Take 120 mg by mouth at bedtime.   Yes Historical Provider, MD   BP 114/76  Pulse 97  Temp(Src) 98.1 F (36.7 C) (Oral)  Resp 16  Ht 5\' 6"  (1.676 m)  Wt 130 lb 1.6 oz (59.013 kg)  BMI 21.01 kg/m2  SpO2 98% Physical Exam  Nursing note and vitals reviewed. Constitutional: She is oriented to person, place, and time. She appears well-developed  and well-nourished. No distress.  HENT:  Head: Normocephalic and atraumatic.  Mouth/Throat: Oropharynx is clear and moist.  Eyes: EOM are normal. Pupils are equal, round, and reactive to light.  There is no papilledema on funduscopic exam  Neck: Normal range of motion. Neck supple.  Cardiovascular: Normal rate and regular rhythm.  Exam reveals no gallop and no friction rub.   No murmur heard. Pulmonary/Chest: Effort normal and breath sounds normal. No respiratory distress. She has no wheezes.  Abdominal: Soft. Bowel sounds are normal. She exhibits no distension.  There is no tenderness.  Musculoskeletal: Normal range of motion. She exhibits no edema.  Neurological: She is alert and oriented to person, place, and time. No cranial nerve deficit. She exhibits normal muscle tone. Coordination normal.  Skin: Skin is warm and dry. She is not diaphoretic.    ED Course  Procedures (including critical care time) Labs Review Labs Reviewed  CBC WITH DIFFERENTIAL - Abnormal; Notable for the following:    Neutrophils Relative % 82 (*)    Monocytes Relative 2 (*)    All other components within normal limits  COMPREHENSIVE METABOLIC PANEL - Abnormal; Notable for the following:    Glucose, Bld 107 (*)    Total Bilirubin <0.2 (*)    All other components within normal limits    Imaging Review Ct Head Wo Contrast  12/06/2013   CLINICAL DATA:  Fall.  Headache.  EXAM: CT HEAD WITHOUT CONTRAST  TECHNIQUE: Contiguous axial images were obtained from the base of the skull through the vertex without intravenous contrast.  COMPARISON:  None.  FINDINGS: Skull and Sinuses:Negative for fracture or destructive process. Mild inflammatory mucosal thickening in the bilateral ethmoid sinuses.  Orbits: No acute abnormality.  Brain: No evidence of acute abnormality, such as acute infarction, hemorrhage, hydrocephalus, or mass lesion/mass effect.  IMPRESSION: Negative head CT.   Electronically Signed   By: Tiburcio PeaJonathan  Watts M.D.   On: 12/06/2013 02:23     EKG Interpretation None      MDM   Final diagnoses:  None    Patient feeling better with IV fluids and migraine cocktail. CT of the head is unremarkable and laboratory studies are normal. She is to followup with her primary doctor if not improving in the next several days.    Geoffery Lyonsouglas Mallie Giambra, MD 12/06/13 [phone]

## 2013-12-06 NOTE — Discharge Instructions (Signed)
Return to the emergency department if your symptoms substantially worsen or change. Follow up with your primary doctor in the next several days.   Migraine Headache A migraine headache is an intense, throbbing pain on one or both sides of your head. A migraine can last for 30 minutes to several hours. CAUSES  The exact cause of a migraine headache is not always known. However, a migraine may be caused when nerves in the brain become irritated and release chemicals that cause inflammation. This causes pain. Certain things may also trigger migraines, such as:  Alcohol.  Smoking.  Stress.  Menstruation.  Aged cheeses.  Foods or drinks that contain nitrates, glutamate, aspartame, or tyramine.  Lack of sleep.  Chocolate.  Caffeine.  Hunger.  Physical exertion.  Fatigue.  Medicines used to treat chest pain (nitroglycerine), birth control pills, estrogen, and some blood pressure medicines. SIGNS AND SYMPTOMS  Pain on one or both sides of your head.  Pulsating or throbbing pain.  Severe pain that prevents daily activities.  Pain that is aggravated by any physical activity.  Nausea, vomiting, or both.  Dizziness.  Pain with exposure to bright lights, loud noises, or activity.  General sensitivity to bright lights, loud noises, or smells. Before you get a migraine, you may get warning signs that a migraine is coming (aura). An aura may include:  Seeing flashing lights.  Seeing bright spots, halos, or zig-zag lines.  Having tunnel vision or blurred vision.  Having feelings of numbness or tingling.  Having trouble talking.  Having muscle weakness. DIAGNOSIS  A migraine headache is often diagnosed based on:  Symptoms.  Physical exam.  A CT scan or MRI of your head. These imaging tests cannot diagnose migraines, but they can help rule out other causes of headaches. TREATMENT Medicines may be given for pain and nausea. Medicines can also be given to help  prevent recurrent migraines.  HOME CARE INSTRUCTIONS  Only take over-the-counter or prescription medicines for pain or discomfort as directed by your health care provider. The use of long-term narcotics is not recommended.  Lie down in a dark, quiet room when you have a migraine.  Keep a journal to find out what may trigger your migraine headaches. For example, write down:  What you eat and drink.  How much sleep you get.  Any change to your diet or medicines.  Limit alcohol consumption.  Quit smoking if you smoke.  Get 7-9 hours of sleep, or as recommended by your health care provider.  Limit stress.  Keep lights dim if bright lights bother you and make your migraines worse. SEEK IMMEDIATE MEDICAL CARE IF:   Your migraine becomes severe.  You have a fever.  You have a stiff neck.  You have vision loss.  You have muscular weakness or loss of muscle control.  You start losing your balance or have trouble walking.  You feel faint or pass out.  You have severe symptoms that are different from your first symptoms. MAKE SURE YOU:   Understand these instructions.  Will watch your condition.  Will get help right away if you are not doing well or get worse. Document Released: 05/12/2005 Document Revised: 03/02/2013 Document Reviewed: 01/17/2013 [facility] Patient Information 2015 [place], [place]. This information is not intended to replace advice given to you by your health care provider. Make sure you discuss any questions you have with your health care provider.

## 2014-05-27 ENCOUNTER — Emergency Department (HOSPITAL_COMMUNITY): Payer: PRIVATE HEALTH INSURANCE

## 2014-05-27 ENCOUNTER — Encounter (HOSPITAL_COMMUNITY): Payer: Self-pay | Admitting: Emergency Medicine

## 2014-05-27 ENCOUNTER — Emergency Department (HOSPITAL_COMMUNITY)
Admission: EM | Admit: 2014-05-27 | Discharge: 2014-05-27 | Disposition: A | Discharge status: Home / Self Care | Payer: PRIVATE HEALTH INSURANCE | Attending: Emergency Medicine | Admitting: Emergency Medicine

## 2014-05-27 DIAGNOSIS — S0990XA Unspecified injury of head, initial encounter: Secondary | ICD-10-CM | POA: Diagnosis not present

## 2014-05-27 DIAGNOSIS — Z8742 Personal history of other diseases of the female genital tract: Secondary | ICD-10-CM | POA: Diagnosis not present

## 2014-05-27 DIAGNOSIS — Y9289 Other specified places as the place of occurrence of the external cause: Secondary | ICD-10-CM | POA: Diagnosis not present

## 2014-05-27 DIAGNOSIS — F909 Attention-deficit hyperactivity disorder, unspecified type: Secondary | ICD-10-CM | POA: Diagnosis not present

## 2014-05-27 DIAGNOSIS — Z7982 Long term (current) use of aspirin: Secondary | ICD-10-CM | POA: Diagnosis not present

## 2014-05-27 DIAGNOSIS — S060X9A Concussion with loss of consciousness of unspecified duration, initial encounter: Secondary | ICD-10-CM

## 2014-05-27 DIAGNOSIS — Z720 Tobacco use: Secondary | ICD-10-CM | POA: Diagnosis not present

## 2014-05-27 DIAGNOSIS — Y998 Other external cause status: Secondary | ICD-10-CM | POA: Insufficient documentation

## 2014-05-27 DIAGNOSIS — S060X0A Concussion without loss of consciousness, initial encounter: Secondary | ICD-10-CM | POA: Diagnosis not present

## 2014-05-27 DIAGNOSIS — S20212A Contusion of left front wall of thorax, initial encounter: Secondary | ICD-10-CM | POA: Diagnosis not present

## 2014-05-27 DIAGNOSIS — E119 Type 2 diabetes mellitus without complications: Secondary | ICD-10-CM | POA: Insufficient documentation

## 2014-05-27 DIAGNOSIS — Y9389 Activity, other specified: Secondary | ICD-10-CM | POA: Diagnosis not present

## 2014-05-27 DIAGNOSIS — W01198A Fall on same level from slipping, tripping and stumbling with subsequent striking against other object, initial encounter: Secondary | ICD-10-CM | POA: Insufficient documentation

## 2014-05-27 DIAGNOSIS — S0291XA Unspecified fracture of skull, initial encounter for closed fracture: Secondary | ICD-10-CM | POA: Insufficient documentation

## 2014-05-27 DIAGNOSIS — S40012A Contusion of left shoulder, initial encounter: Secondary | ICD-10-CM | POA: Diagnosis not present

## 2014-05-27 DIAGNOSIS — Z79899 Other long term (current) drug therapy: Secondary | ICD-10-CM | POA: Insufficient documentation

## 2014-05-27 DIAGNOSIS — S060XAA Concussion with loss of consciousness status unknown, initial encounter: Secondary | ICD-10-CM

## 2014-05-27 HISTORY — DX: Other specified behavioral and emotional disorders with onset usually occurring in childhood and adolescence: F98.8

## 2014-05-27 LAB — CBG MONITORING, ED: GLUCOSE-CAPILLARY: 99 mg/dL (ref 70–99)

## 2014-05-27 MED ORDER — HYDROCODONE-ACETAMINOPHEN 5-325 MG PO TABS: 1.0000 | ORAL_TABLET | Freq: Four times a day (QID) | ORAL | Status: AC | PRN

## 2014-05-27 MED ORDER — HYDROMORPHONE HCL 1 MG/ML IJ SOLN
1.0000 mg | Freq: Once | INTRAMUSCULAR | Status: AC
  Administered 2014-05-27: 1 mg via INTRAVENOUS
  Filled 2014-05-27: qty 1

## 2014-05-27 MED ORDER — SODIUM CHLORIDE 0.9 % IV BOLUS (SEPSIS)
1000.0000 mL | Freq: Once | INTRAVENOUS | Status: AC
  Administered 2014-05-27: 1000 mL via INTRAVENOUS

## 2014-05-27 MED ORDER — PROMETHAZINE HCL 25 MG/ML IJ SOLN
12.5000 mg | Freq: Once | INTRAMUSCULAR | Status: AC
  Administered 2014-05-27: 12.5 mg via INTRAVENOUS
  Filled 2014-05-27: qty 1

## 2014-05-27 MED ORDER — ONDANSETRON HCL 4 MG/2ML IJ SOLN
4.0000 mg | Freq: Once | INTRAMUSCULAR | Status: AC
  Administered 2014-05-27: 4 mg via INTRAVENOUS
  Filled 2014-05-27: qty 2

## 2014-05-27 MED ORDER — DIPHENHYDRAMINE HCL 50 MG/ML IJ SOLN
25.0000 mg | Freq: Once | INTRAMUSCULAR | Status: AC
  Administered 2014-05-27: 25 mg via INTRAVENOUS
  Filled 2014-05-27: qty 1

## 2014-05-27 MED ORDER — ONDANSETRON 4 MG PO TBDP
4.0000 mg | ORAL_TABLET | Freq: Three times a day (TID) | ORAL | Status: AC | PRN
Start: 2014-05-27 — End: ?

## 2014-05-27 MED ORDER — DEXAMETHASONE SODIUM PHOSPHATE 10 MG/ML IJ SOLN
10.0000 mg | Freq: Once | INTRAMUSCULAR | Status: AC
  Administered 2014-05-27: 10 mg via INTRAVENOUS
  Filled 2014-05-27: qty 1

## 2014-05-27 MED ORDER — SODIUM CHLORIDE 0.9 % IV SOLN
INTRAVENOUS | Status: DC
  Administered 2014-05-27: 100 mL/h via INTRAVENOUS

## 2014-05-27 NOTE — ED Notes (Signed)
Pt. reports migraine headache for several days with photophobia , nausea and mild dizziness.

## 2014-05-27 NOTE — Discharge Instructions (Signed)
Concussion A concussion, or closed-head injury, is a brain injury caused by a direct blow to the head or by a quick and sudden movement (jolt) of the head or neck. Concussions are usually not life-threatening. Even so, the effects of a concussion can be serious. If you have had a concussion before, you are more likely to experience concussion-like symptoms after a direct blow to the head.  CAUSES  Direct blow to the head, such as from running into another player during a soccer game, being hit in a fight, or hitting your head on a hard surface.  A jolt of the head or neck that causes the brain to move back and forth inside the skull, such as in a car crash. SIGNS AND SYMPTOMS The signs of a concussion can be hard to notice. Early on, they may be missed by you, family members, and health care providers. You may look fine but act or feel differently. Symptoms are usually temporary, but they may last for days, weeks, or even longer. Some symptoms may appear right away while others may not show up for hours or days. Every head injury is different. Symptoms include:  Mild to moderate headaches that will not go away.  A feeling of pressure inside your head.  Having more trouble than usual:  Learning or remembering things you have heard.  Answering questions.  Paying attention or concentrating.  Organizing daily tasks.  Making decisions and solving problems.  Slowness in thinking, acting or reacting, speaking, or reading.  Getting lost or being easily confused.  Feeling tired all the time or lacking energy (fatigued).  Feeling drowsy.  Sleep disturbances.  Sleeping more than usual.  Sleeping less than usual.  Trouble falling asleep.  Trouble sleeping (insomnia).  Loss of balance or feeling lightheaded or dizzy.  Nausea or vomiting.  Numbness or tingling.  Increased sensitivity to:  Sounds.  Lights.  Distractions.  Vision problems or eyes that tire  easily.  Diminished sense of taste or smell.  Ringing in the ears.  Mood changes such as feeling sad or anxious.  Becoming easily irritated or angry for little or no reason.  Lack of motivation.  Seeing or hearing things other people do not see or hear (hallucinations). DIAGNOSIS Your health care provider can usually diagnose a concussion based on a description of your injury and symptoms. He or she will ask whether you passed out (lost consciousness) and whether you are having trouble remembering events that happened right before and during your injury. Your evaluation might include:  A brain scan to look for signs of injury to the brain. Even if the test shows no injury, you may still have a concussion.  Blood tests to be sure other problems are not present. TREATMENT  Concussions are usually treated in an emergency department, in urgent care, or at a clinic. You may need to stay in the hospital overnight for further treatment.  Tell your health care provider if you are taking any medicines, including prescription medicines, over-the-counter medicines, and natural remedies. Some medicines, such as blood thinners (anticoagulants) and aspirin, may increase the chance of complications. Also tell your health care provider whether you have had alcohol or are taking illegal drugs. This information may affect treatment.  Your health care provider will send you home with important instructions to follow.  How fast you will recover from a concussion depends on many factors. These factors include how severe your concussion is, what part of your brain was injured, your  age, and how healthy you were before the concussion. °· Most people with mild injuries recover fully. Recovery can take time. In general, recovery is slower in older persons. Also, persons who have had a concussion in the past or have other medical problems may find that it takes longer to recover from their current injury. °HOME  CARE INSTRUCTIONS °General Instructions °· Carefully follow the directions your health care provider gave you. °· Only take over-the-counter or prescription medicines for pain, discomfort, or fever as directed by your health care provider. °· Take only those medicines that your health care provider has approved. °· Do not drink alcohol until your health care provider says you are well enough to do so. Alcohol and certain other drugs may slow your recovery and can put you at risk of further injury. °· If it is harder than usual to remember things, write them down. °· If you are easily distracted, try to do one thing at a time. For example, do not try to watch TV while fixing dinner. °· Talk with family members or close friends when making important decisions. °· Keep all follow-up appointments. Repeated evaluation of your symptoms is recommended for your recovery. °· Watch your symptoms and tell others to do the same. Complications sometimes occur after a concussion. Older adults with a brain injury may have a higher risk of serious complications, such as a blood clot on the brain. °· Tell your teachers, school nurse, school counselor, coach, athletic trainer, or work manager about your injury, symptoms, and restrictions. Tell them about what you can or cannot do. They should watch for: °¨ Increased problems with attention or concentration. °¨ Increased difficulty remembering or learning new information. °¨ Increased time needed to complete tasks or assignments. °¨ Increased irritability or decreased ability to cope with stress. °¨ Increased symptoms. °· Rest. Rest helps the brain to heal. Make sure you: °¨ Get plenty of sleep at night. Avoid staying up late at night. °¨ Keep the same bedtime hours on weekends and weekdays. °¨ Rest during the day. Take daytime naps or rest breaks when you feel tired. °· Limit activities that require a lot of thought or concentration. These include: °¨ Doing homework or job-related  work. °¨ Watching TV. °¨ Working on the computer. °· Avoid any situation where there is potential for another head injury (football, hockey, soccer, basketball, martial arts, downhill snow sports and horseback riding). Your condition will get worse every time you experience a concussion. You should avoid these activities until you are evaluated by the appropriate follow-up health care providers. °Returning To Your Regular Activities °You will need to return to your normal activities slowly, not all at once. You must give your body and brain enough time for recovery. °· Do not return to sports or other athletic activities until your health care provider tells you it is safe to do so. °· Ask your health care provider when you can drive, ride a bicycle, or operate heavy machinery. Your ability to react may be slower after a brain injury. Never do these activities if you are dizzy. °· Ask your health care provider about when you can return to work or school. °Preventing Another Concussion °It is very important to avoid another brain injury, especially before you have recovered. In rare cases, another injury can lead to permanent brain damage, brain swelling, or death. The risk of this is greatest during the first 7-10 days after a head injury. Avoid injuries by: °· Wearing a seat   belt when riding in a car.  Drinking alcohol only in moderation.  Wearing a helmet when biking, skiing, skateboarding, skating, or doing similar activities.  Avoiding activities that could lead to a second concussion, such as contact or recreational sports, until your health care provider says it is okay.  Taking safety measures in your home.  Remove clutter and tripping hazards from floors and stairways.  Use grab bars in bathrooms and handrails by stairs.  Place non-slip mats on floors and in bathtubs.  Improve lighting in dim areas. SEEK MEDICAL CARE IF:  You have increased problems paying attention or  concentrating.  You have increased difficulty remembering or learning new information.  You need more time to complete tasks or assignments than before.  You have increased irritability or decreased ability to cope with stress.  You have more symptoms than before. Seek medical care if you have any of the following symptoms for more than 2 weeks after your injury:  Lasting (chronic) headaches.  Dizziness or balance problems.  Nausea.  Vision problems.  Increased sensitivity to noise or light.  Depression or mood swings.  Anxiety or irritability.  Memory problems.  Difficulty concentrating or paying attention.  Sleep problems.  Feeling tired all the time. SEEK IMMEDIATE MEDICAL CARE IF:  You have severe or worsening headaches. These may be a sign of a blood clot in the brain.  You have weakness (even if only in one hand, leg, or part of the face).  You have numbness.  You have decreased coordination.  You vomit repeatedly.  You have increased sleepiness.  One pupil is larger than the other.  You have convulsions.  You have slurred speech.  You have increased confusion. This may be a sign of a blood clot in the brain.  You have increased restlessness, agitation, or irritability.  You are unable to recognize people or places.  You have neck pain.  It is difficult to wake you up.  You have unusual behavior changes.  You lose consciousness. MAKE SURE YOU:  Understand these instructions.  Will watch your condition.  Will get help right away if you are not doing well or get worse. Document Released: 08/02/2003 Document Revised: 05/17/2013 Document Reviewed: 12/02/2012 [facility] Patient Information 2015 [place], [place]. This information is not intended to replace advice given to you by your health care provider. Make sure you discuss any questions you have with your health care provider.  Follow-up with neurosurgery for the concussion in the skull  fracture. Take pain medicine as directed. Takes Zofran as needed for any nausea.

## 2014-05-27 NOTE — ED Notes (Signed)
Pt reports migraine for several days worsening this evening.  Husband states she fell in the bathroom 6 days ago and hit her head, unclear if headache is from the fall or if it is one of her migraines which she has regularly.

## 2014-05-27 NOTE — ED Provider Notes (Signed)
CSN: [id number]     Arrival date & time 05/27/14  0024 History   First MD Initiated Contact with Patient 05/27/14 0130     This chart was scribed for Vanetta Mulders, MD by Arlan Organ, ED Scribe. This patient was seen in room D30C/D30C and the patient's care was started 4:00 AM.   Chief Complaint  Patient presents with  . Migraine   Patient is a 34 y.o. female presenting with migraines. The history is provided by the patient. No language interpreter was used.  Migraine This is a recurrent problem. The current episode started more than 1 week ago. The problem occurs daily. The problem has been gradually worsening. Associated symptoms include chest pain, headaches and shortness of breath. Pertinent negatives include no abdominal pain. The symptoms are aggravated by walking and standing. The symptoms are relieved by rest. She has tried nothing for the symptoms.    HPI Comments: Brenda Roberts is a 34 y.o. female with a PMHx of borderline DM who presents to the Emergency Department complaining of an intermittent R sided and posterior migraine x 1 week that has progressively worsened in last few days. HA described as throbbing and rated 8/10 at this time.  She also reports blurred and double vision along with nausea, vomiting, and photophobia. Symptoms are worsened with standing and ambulation. She admits to a history of Migraines but states this episode feels different. Pt states she experienced some dizziness resulting in a fall in her shower approximately 1 week ago. Pt was not evaluated at time of fall but feels symptoms started after fall. No known allergies to medications.  Past Medical History  Diagnosis Date  . Diabetes mellitus     borderline  . Endometriosis of pelvic peritoneum   . ADD (attention deficit disorder)    Past Surgical History  Procedure Laterality Date  . Cesarean section  1998  . Abdominal hysterectomy    . Ectopic pregnancy surgery     No family history on  file. History  Substance Use Topics  . Smoking status: Current Every Day Smoker -- 0.50 packs/day    Types: Cigarettes  . Smokeless tobacco: Not on file  . Alcohol Use: No   OB History    No data available     Review of Systems  Constitutional: Negative for fever and chills.  HENT: Negative for congestion, rhinorrhea and sore throat.   Eyes: Positive for photophobia and visual disturbance.  Respiratory: Positive for shortness of breath. Negative for cough.   Cardiovascular: Positive for chest pain. Negative for leg swelling.  Gastrointestinal: Positive for nausea and vomiting. Negative for abdominal pain.  Genitourinary: Negative for dysuria.  Musculoskeletal: Negative for back pain.  Skin: Negative for rash.  Neurological: Positive for headaches.  Hematological: Does not bruise/bleed easily.  Psychiatric/Behavioral: Negative for confusion.      Allergies  Sweet potato  Home Medications   Prior to Admission medications   Medication Sig Start Date End Date Taking? Authorizing Provider  amphetamine-dextroamphetamine (ADDERALL) 30 MG tablet Take 30 mg by mouth 2 (two) times daily as needed (ADD).   Yes Historical Provider, MD  Aspirin-Salicylamide-Caffeine (BC HEADACHE POWDER PO) Take 1 packet by mouth 4 (four) times daily as needed (pain).   Yes Historical Provider, MD  butalbital-acetaminophen-caffeine (FIORICET WITH CODEINE) 50-325-40-30 MG per capsule Take 1 capsule by mouth every 4 (four) hours as needed for headache or migraine.   Yes Historical Provider, MD  clonazePAM (KLONOPIN) 0.5 MG tablet Take 0.5 mg by  mouth 2 (two) times daily as needed for anxiety.    Yes Historical Provider, MD  metFORMIN (GLUCOPHAGE) 500 MG tablet Take 1,000 mg by mouth at bedtime.    Yes Historical Provider, MD  naproxen sodium (ANAPROX) 220 MG tablet Take 660-880 mg by mouth 4 (four) times daily as needed (pain).   Yes Historical Provider, MD  nortriptyline (PAMELOR) 25 MG capsule Take 25 mg  by mouth at bedtime.   Yes Historical Provider, MD  pramipexole (MIRAPEX) 0.125 MG tablet Take 0.25 mg by mouth at bedtime.   Yes Historical Provider, MD  promethazine (PHENERGAN) 25 MG tablet Take 25 mg by mouth every 6 (six) hours as needed for nausea or vomiting.   Yes Historical Provider, MD  HYDROcodone-acetaminophen (NORCO/VICODIN) 5-325 MG per tablet Take 1-2 tablets by mouth every 6 (six) hours as needed for moderate pain. 05/27/14   Vanetta Mulders, MD  ondansetron (ZOFRAN ODT) 4 MG disintegrating tablet Take 1 tablet (4 mg total) by mouth every 8 (eight) hours as needed for nausea or vomiting. 05/27/14   Vanetta Mulders, MD  rizatriptan (MAXALT-MLT) 10 MG disintegrating tablet Take 10 mg by mouth every 2 (two) hours as needed for migraine. May repeat in 2 hours if needed    Historical Provider, MD  verapamil (CALAN-SR) 120 MG CR tablet Take 120 mg by mouth at bedtime.    Historical Provider, MD   Triage Vitals: BP 113/75 mmHg  Pulse 81  Temp(Src) 98.3 F (36.8 C) (Oral)  Resp 16  SpO2 100%   Physical Exam  Constitutional: She is oriented to person, place, and time. She appears well-developed and well-nourished. No distress.  HENT:  Head: Normocephalic and atraumatic.  Mouth/Throat: Oropharynx is clear and moist.  Eyes: Conjunctivae and EOM are normal. Pupils are equal, round, and reactive to light.  Sclera clear  Neck: Normal range of motion.  Cardiovascular: Normal rate, regular rhythm and normal heart sounds.   No murmur heard. Pulmonary/Chest: Effort normal and breath sounds normal.  Abdominal: Soft. She exhibits no distension. There is no tenderness.  Musculoskeletal: Normal range of motion. She exhibits no edema.  Neurological: She is alert and oriented to person, place, and time. No cranial nerve deficit. She exhibits normal muscle tone. Coordination normal.  Skin: Skin is warm and dry.  Bruise on top of L shoulder 2 cm in size Small bruise to L anterior rib margin 1 cm in  size  Psychiatric: She has a normal mood and affect. Judgment normal.  Nursing note and vitals reviewed.   ED Course  Procedures (including critical care time)  DIAGNOSTIC STUDIES: Oxygen Saturation is 100% on RA, Normal by my interpretation.    COORDINATION OF CARE: 4:00 AM-Discussed treatment plan with pt at bedside and pt agreed to plan.     Labs Review Labs Reviewed  CBG MONITORING, ED    Imaging Review Ct Head Wo Contrast  05/27/2014   CLINICAL DATA:  Dizzy spells for 1 week. Patient fell last Saturday and on Thursday, striking head to the posterior and right side. Right-sided headache.  EXAM: CT HEAD WITHOUT CONTRAST  TECHNIQUE: Contiguous axial images were obtained from the base of the skull through the vertex without intravenous contrast.  COMPARISON:  12/06/2013  FINDINGS: Ventricles and sulci appear symmetrical. No mass effect or midline shift. No abnormal extra-axial fluid collections. Gray-white matter junctions are distinct. Basal cisterns are not effaced. No evidence of acute intracranial hemorrhage. Linear lucency in the right posterior occipital skull consistent with non  depressed basal skull fracture. Fracture extends to the foramen magnum. Mucosal thickening in the paranasal sinuses with mucous retention cysts in the left maxillary antrum.  IMPRESSION: Nondepressed right posterior occipital skull fracture. No acute intracranial abnormalities. Inflammatory changes in the paranasal sinuses.   Electronically Signed   By: Burman Nieves M.D.   On: 05/27/2014 02:24     EKG Interpretation None      MDM   Final diagnoses:  Head injury  Skull fracture with concussion, closed, initial encounter    Patient status post fall in the shower. Complaint of pain in the right posterior part of the head radiating to the right side. CT scan shows no brain injury but shows a closed skull fracture. Nondepressed. Patient's symptoms consistent with this and probably concussion. Patient  has a history of migraines however this headache was very different than her migraines. Patient nontoxic no acute distress. No significant neuro findings. Patient given follow-up with neurosurgery.  I personally performed the services described in this documentation, which was scribed in my presence. The recorded information has been reviewed and is accurate.    Vanetta Mulders, MD 05/27/14 [phone]

## 2016-03-23 IMAGING — CT CT HEAD W/O CM
1 series · 16 of 30 positions shown, 20 images · non-contrast
Comparison: 12/06/2013

CLINICAL DATA: Dizzy spells for 1 week. Patient fell last [REDACTED]
and on [REDACTED], striking head to the posterior and right side.
Right-sided headache.

EXAM:
CT HEAD WITHOUT CONTRAST
TECHNIQUE: Contiguous axial images were obtained from the base of the skull
through the vertex without intravenous contrast.

[Series 2: head 5.0 h30s · axial · 0.43mm/px · z∈[-146,-1]mm · 16 of 33 slices shown, 20 images]
[im 2/33  brain]
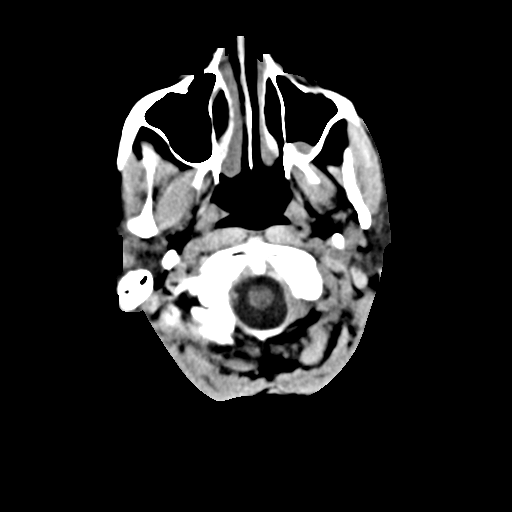
[im 2/33  bone]
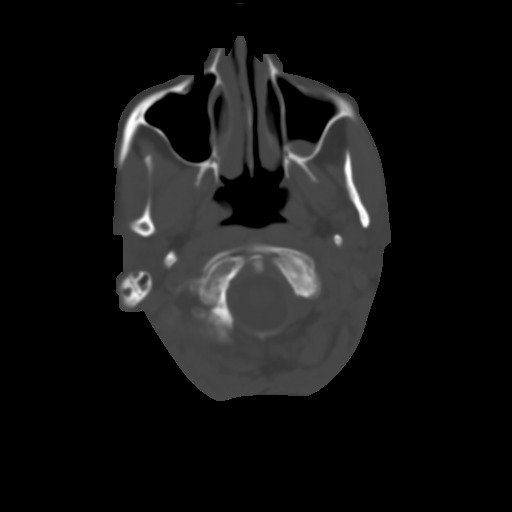
[im 4/33  brain]
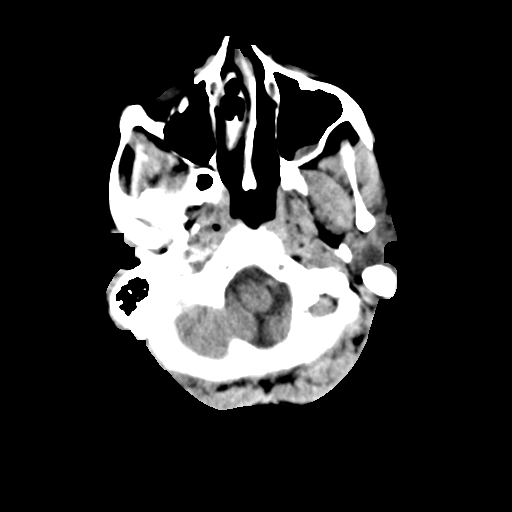
[im 6/33  brain]
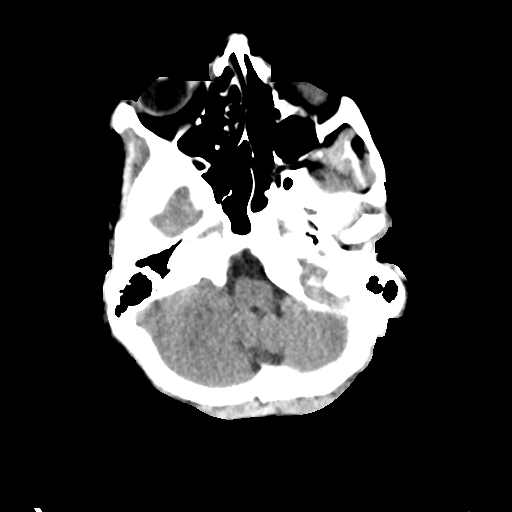
[im 8/33  brain]
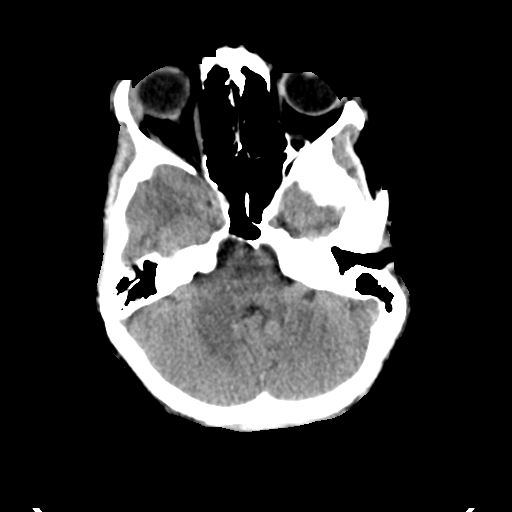
[im 9/33  brain]
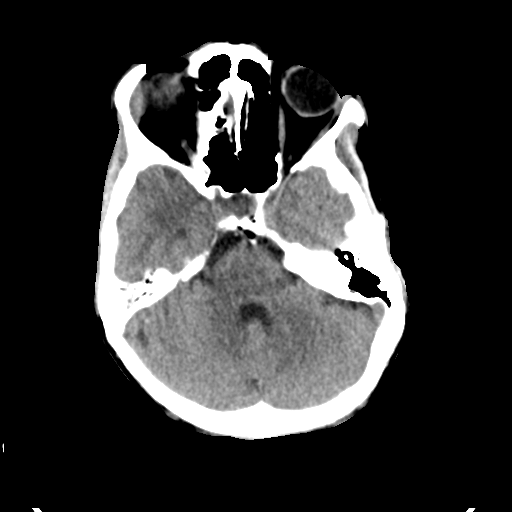
[im 9/33  bone]
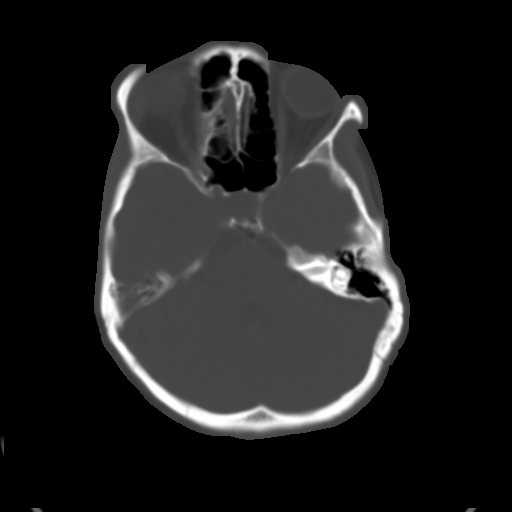
[im 12/33  brain]
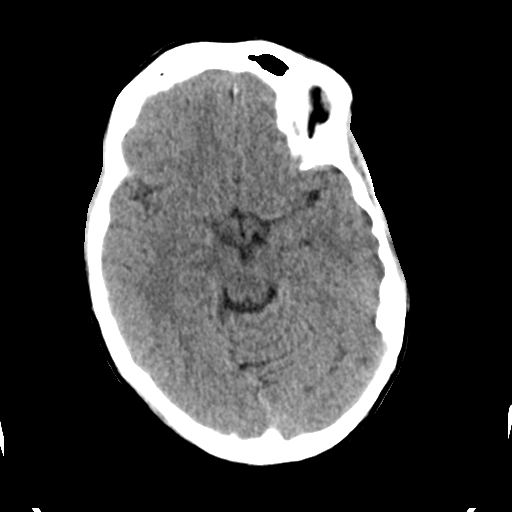
[im 14/33  brain]
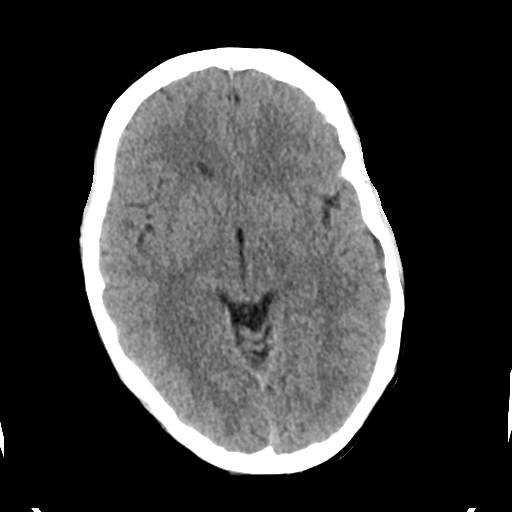
[im 16/33  brain]
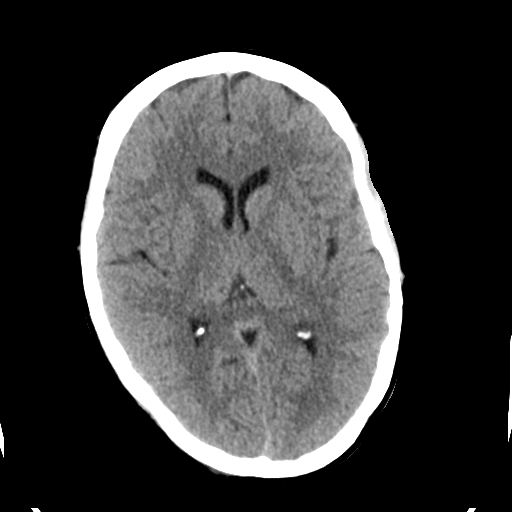
[im 17/33  brain]
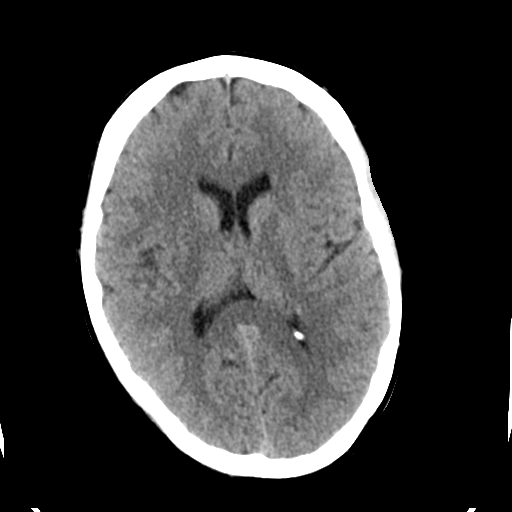
[im 17/33  bone]
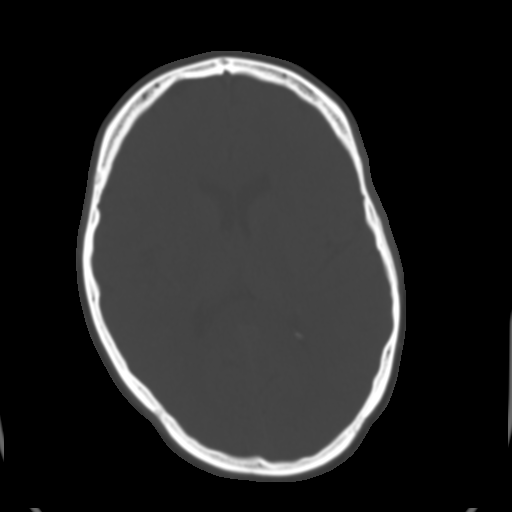
[im 19/33  brain]
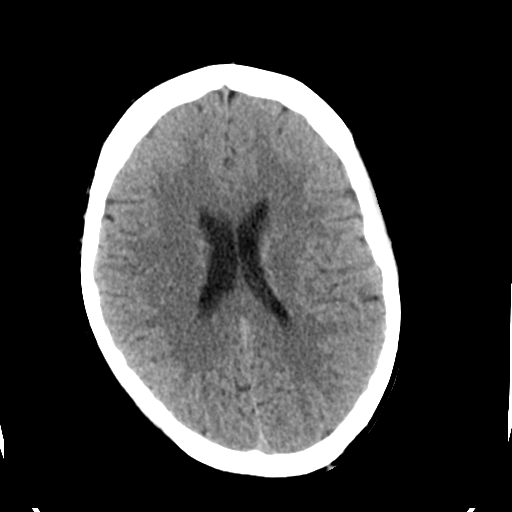
[im 21/33  brain]
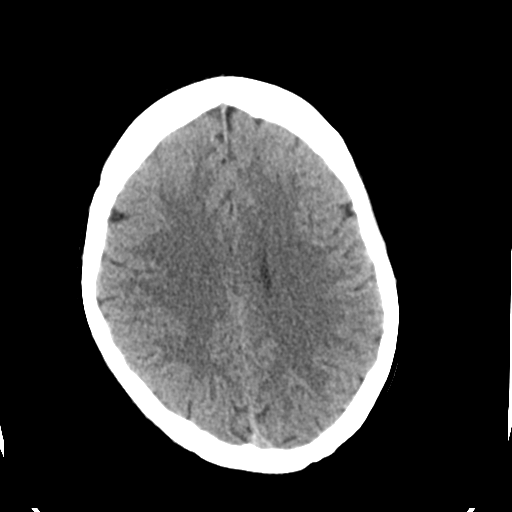
[im 24/33  brain]
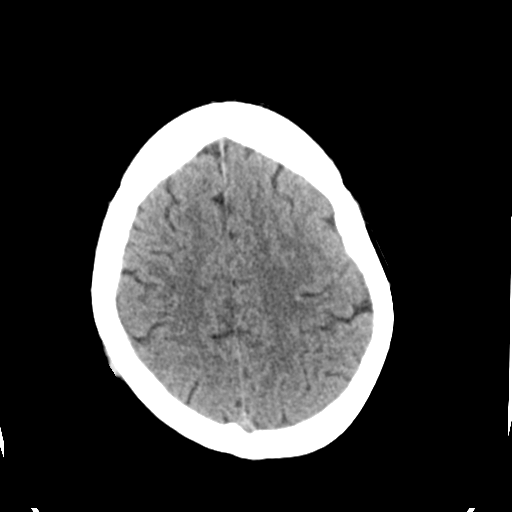
[im 25/33  brain]
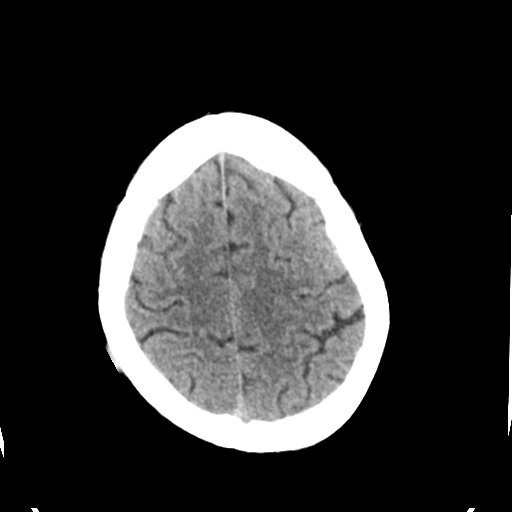
[im 25/33  bone]
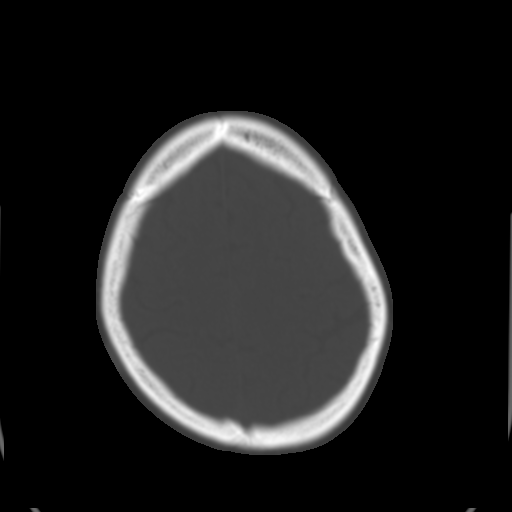
[im 27/33  brain]
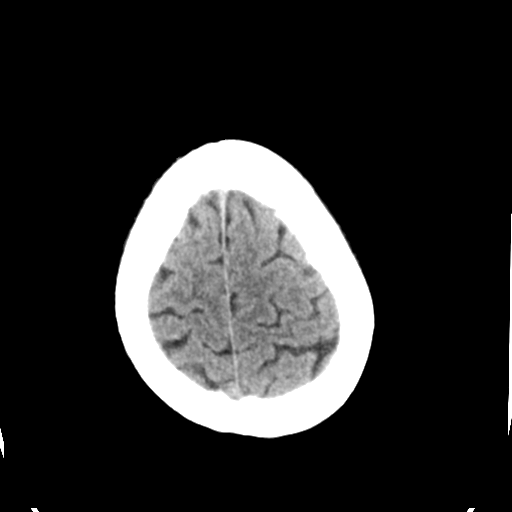
[im 29/33  brain]
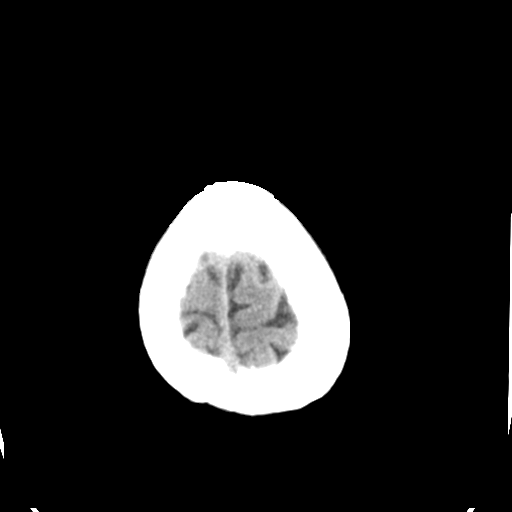
[im 31/33  brain]
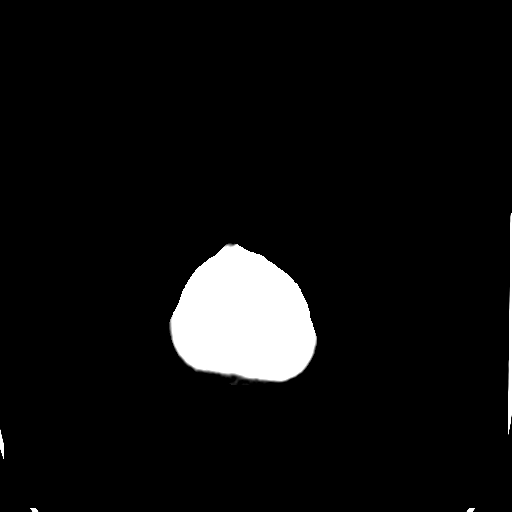

[16 of 30 positions shown; findings below may reference images not displayed]

FINDINGS: Ventricles and sulci appear symmetrical. No mass effect or midline
shift. No abnormal extra-axial fluid collections. Gray-white matter
junctions are distinct. Basal cisterns are not effaced. No evidence
of acute intracranial hemorrhage. Linear lucency in the right
posterior occipital skull consistent with non depressed basal skull
fracture. Fracture extends to the foramen magnum. Mucosal thickening
in the paranasal sinuses with mucous retention cysts in the left
maxillary antrum.
IMPRESSION: Nondepressed right posterior occipital skull fracture. No acute
intracranial abnormalities. Inflammatory changes in the paranasal
sinuses.

## 2018-05-24 ENCOUNTER — Encounter: Payer: Self-pay | Admitting: Gastroenterology

## 2018-06-01 ENCOUNTER — Ambulatory Visit: Payer: PRIVATE HEALTH INSURANCE | Admitting: Gastroenterology

## 2018-06-22 ENCOUNTER — Ambulatory Visit: Payer: No Typology Code available for payment source | Admitting: Gastroenterology

## 2018-06-22 ENCOUNTER — Encounter: Payer: Self-pay | Admitting: Gastroenterology

## 2018-06-22 VITALS — BP 106/70 | HR 88 | Ht 66.0 in | Wt 141.2 lb

## 2018-06-22 DIAGNOSIS — K5732 Diverticulitis of large intestine without perforation or abscess without bleeding: Secondary | ICD-10-CM

## 2018-06-22 DIAGNOSIS — G8929 Other chronic pain: Secondary | ICD-10-CM

## 2018-06-22 DIAGNOSIS — K5909 Other constipation: Secondary | ICD-10-CM | POA: Diagnosis not present

## 2018-06-22 DIAGNOSIS — R1032 Left lower quadrant pain: Secondary | ICD-10-CM

## 2018-06-22 DIAGNOSIS — R1013 Epigastric pain: Secondary | ICD-10-CM

## 2018-06-22 NOTE — Progress Notes (Addendum)
[facility] Consult Note:  History: Brenda Roberts 06/22/2018  Referring physician: Dr. 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  This history was entirely obtained from the patient, currently no primary care or hospital records available for review.  Brenda Roberts reports admission to hospital in early December for 2 days of escalating lower abdominal pain that started to center more in the left lower quadrant.  She was admitted for IV antibiotics treating diverticulitis.  They apparently wanted her to stay longer, but she left after about 2 days on about a 10-day course of ciprofloxacin and Flagyl.  She was back to work shortly after that around the Christmas holiday, but then had worsening pain and a fever of 102.  She was given another course of the same antibiotics and kept out of work a bit longer.  Since then she has steadily improved, but still has left lower quadrant pain.  Brenda Roberts reports chronic constipation for years, though since hospital stay bowel habits have been somewhat more regular.  She is often unable to stop work to use the bathroom when she feels the need to do so, admits she does not drink enough water, and has too much caffeine/energy drinks.  She had a episode of diverticulitis in about 2013 that she reports was much less severe.  She recalls an EGD and colonoscopy by [facility], and says she may have had an ulcer.  She has chronic intermittent epigastric burning discomfort, and  takes Protonix occasionally.  She has not experienced rectal bleeding before or after this recent episode.  ROS:  Review of Systems  Constitutional: Negative for appetite change and unexpected weight change.  HENT: Negative for mouth sores and voice change.   Eyes: Negative for pain and redness.  Respiratory: Negative for cough and shortness of breath.   Cardiovascular: Negative for chest pain and palpitations.  Genitourinary: Positive for frequency. Negative for dysuria and hematuria.  Musculoskeletal: Negative for arthralgias and myalgias.  Skin: Negative for pallor and rash.  Neurological: Negative for weakness and headaches.  Hematological: Negative for adenopathy.     Past Medical History: Past Medical History:  Diagnosis Date  . ADD (attention deficit disorder)   . Anxiety   . Arthritis   . Depression   . Diabetes mellitus    borderline  . Diverticulosis   . Endometriosis of pelvic peritoneum      Past Surgical History: Past Surgical History:  Procedure Laterality Date  . ABDOMINAL HYSTERECTOMY     with corrective surgery to follow  . CESAREAN SECTION  1998  . ECTOPIC PREGNANCY SURGERY     x 2  . URETHRAL SLING       Family History: Family History  Problem Relation Age of Onset  . Prostate cancer Paternal Grandfather   . Lung cancer Paternal Grandfather   . Bone cancer Paternal Grandfather     Social History: Social History   Socioeconomic History  . Marital status: Married    Spouse name: Not on file  . Number of children: Not on file  .  Years of education: Not on file  . Highest education level: Not on file  Occupational History  . Not on file  Social Needs  . Financial resource strain: Not on file  . Food insecurity:    Worry: Not on file    Inability: Not on file  . Transportation needs:    Medical: Not on file    Non-medical: Not on file  Tobacco Use  . Smoking status: Current Every Day Smoker    Packs/day: 0.50    Types:  Cigarettes  . Smokeless tobacco: Never Used  Substance and Sexual Activity  . Alcohol use: No  . Drug use: No  . Sexual activity: Yes    Birth control/protection: Surgical  Lifestyle  . Physical activity:    Days per week: Not on file    Minutes per session: Not on file  . Stress: Not on file  Relationships  . Social connections:    Talks on phone: Not on file    Gets together: Not on file    Attends religious service: Not on file    Active member of club or organization: Not on file    Attends meetings of clubs or organizations: Not on file    Relationship status: Not on file  Other Topics Concern  . Not on file  Social History Narrative  . Not on file    Allergies: Allergies  Allergen Reactions  . Dilaudid [Hydromorphone Hcl]     Migraines  . Sweet Potato     rash    Outpatient Meds: Current Outpatient Medications  Medication Sig Dispense Refill  . amphetamine-dextroamphetamine (ADDERALL) 20 MG tablet Take 2 tablets by mouth every morning, 1 tablet by mouth every evening    . Aspirin-Salicylamide-Caffeine (BC HEADACHE POWDER PO) Take 1 packet by mouth 4 (four) times daily as needed (pain).    . butalbital-acetaminophen-caffeine (FIORICET WITH CODEINE) 50-325-40-30 MG per capsule Take 1 capsule by mouth every 4 (four) hours as needed for headache or migraine.    . clonazePAM (KLONOPIN) 0.5 MG tablet Take 0.5 mg by mouth 2 (two) times daily as needed for anxiety.     Marland Kitchen. HYDROcodone-acetaminophen (NORCO/VICODIN) 5-325 MG per tablet Take 1-2 tablets by mouth every 6 (six) hours as needed for moderate pain. (Patient taking differently: Take 0.5 tablets by mouth every 6 (six) hours as needed for moderate pain. ) 20 tablet 0  . ondansetron (ZOFRAN ODT) 4 MG disintegrating tablet Take 1 tablet (4 mg total) by mouth every 8 (eight) hours as needed for nausea or vomiting. 12 tablet 1  . pantoprazole (PROTONIX) 40 MG tablet Take 40 mg by mouth daily.    . promethazine (PHENERGAN)  25 MG tablet Take 25 mg by mouth every 6 (six) hours as needed for nausea or vomiting.    . rizatriptan (MAXALT-MLT) 10 MG disintegrating tablet Take 10 mg by mouth every 2 (two) hours as needed for migraine. May repeat in 2 hours if needed     No current facility-administered medications for this visit.       ___________________________________________________________________ Objective   Exam:  BP 106/70   Pulse 88   Ht 5\' 6"  (1.676 m)   Wt 141 lb 3.2 oz (64 kg)   BMI 22.79 kg/m    General: this is a(n) well-appearing woman.  Eyes: sclera anicteric, no redness  ENT: oral mucosa moist without lesions, no cervical or supraclavicular lymphadenopathy  CV: RRR without murmur, S1/S2, no JVD, no peripheral edema  Resp: clear  to auscultation bilaterally, normal RR and effort noted  GI: soft, mild LLQ tenderness, with active bowel sounds. No guarding or palpable organomegaly noted.  Skin; warm and dry, no rash or jaundice noted  Neuro: awake, alert and oriented x 3. Normal gross motor function and fluent speech  Labs:  No data for review at present  Assessment: Encounter Diagnoses  Name Primary?  . Diverticulitis of colon Yes  . LLQ abdominal pain   . Chronic constipation   . Abdominal pain, chronic, epigastric     Episode of diverticulitis in 2013, recent severe protracted episode requiring 2 rounds of antibiotics.  It is not clear if it is entirely resolved, so I have ordered CT abdomen and pelvis with our radiology group.  We have also requested recent hospital discharge summary and CT abdomen report.  Based on all that, it is not clear if she needs a colonoscopy at present.  The diagnosis seems to have been certain, it is just unclear whether the episode is completely resolved.  If she has recurrent episodes within a year or there is any consideration of surgical resection, then colonoscopy would be warranted preoperatively.  She also has epigastric pain that  sounds like dyspepsia from a combination of smoking, excess caffeine and regular use of NSAIDs.  She has been trying to cut all of those back recently.  I recommended she take her Protonix daily to decrease the chance of ulcer.  Constipation, multifactorial given work schedule, insufficient water intake, excess caffeine and concomitant use of ADD medicine.  Plan:  CT abdomen as noted above Get records as noted above Further plan to follow after reviewing those.  Thank you for the courtesy of this consult.  Please call me with any questions or concerns.  Charlie Pitter III  CC: Referring provider noted above  Addendum for record review 06/22/2018  Discharge summary of 04/30/2018 from [facility] in [place]: Descending colon diverticulitis treated with Rocephin and Flagyl, then discharged home on ciprofloxacin and Flagyl.  CT abdomen and pelvis with contrast dated 04/29/2018 (comparison to 12/21/2013 study)  Gallbladder reported to be mildly distended, no inflammation or stones.  Focal wall thickening descending colon with adjacent fat stranding, no abscess. 1.9 cm probable follicle left ovary.  At the time of admission, WBC 9000, hemoglobin 14.5, platelet 199 BMP normal LFTs normal   - Amada Jupiter, MD    [facility]

## 2018-06-22 NOTE — Patient Instructions (Signed)
If you are age 40 or older, your body mass index should be between 23-30. Your Body mass index is 22.79 kg/m. If this is out of the aforementioned range listed, please consider follow up with your Primary Care Provider.  If you are age 79 or younger, your body mass index should be between 19-25. Your Body mass index is 22.79 kg/m. If this is out of the aformentioned range listed, please consider follow up with your Primary Care Provider.   You have been scheduled for a CT scan of the abdomen and pelvis at [facility] ([address]---this is in the same building as Press photographer).   You are scheduled on 07-07-2018 at [place] [facility] should arrive 15 minutes prior to your appointment time for registration. Please follow the written instructions below on the day of your exam:  WARNING: IF YOU ARE ALLERGIC TO IODINE/X-RAY DYE, PLEASE NOTIFY RADIOLOGY IMMEDIATELY AT [phone]! YOU WILL BE GIVEN A 13 HOUR PREMEDICATION PREP.  1) Do not eat or drink anything after 12/noon (4 hours prior to your test) 2) You have been given 2 bottles of oral contrast to drink. The solution may taste better if refrigerated, but do NOT add ice or any other liquid to this solution. Shake well before drinking.    Drink 1 bottle of contrast @ 2pm (2 hours prior to your exam)  Drink 1 bottle of contrast @ 3pm (1 hour prior to your exam)  You may take any medications as prescribed with a small amount of water, if necessary. If you take any of the following medications: METFORMIN, GLUCOPHAGE, GLUCOVANCE, AVANDAMET, RIOMET, FORTAMET, [place] MET, JANUMET, GLUMETZA or METAGLIP, you MAY be asked to HOLD this medication 48 hours AFTER the exam.  The purpose of you drinking the oral contrast is to aid in the visualization of your intestinal tract. The contrast solution may cause some diarrhea. Depending on your individual set of symptoms, you may also receive an intravenous injection of x-ray contrast/dye. Plan on  being at [facility] for 30 minutes or longer, depending on the type of exam you are having performed.  This test typically takes 30-45 minutes to complete.  If you have any questions regarding your exam or if you need to reschedule, you may call the CT department at [phone] between the hours of 8:00 am and 5:00 pm, Monday-Friday.  ________________________________________________________________________ ]

## 2018-07-07 ENCOUNTER — Inpatient Hospital Stay: Admission: RE | Admit: 2018-07-07 | Payer: No Typology Code available for payment source | Source: Ambulatory Visit

## 2018-07-22 ENCOUNTER — Ambulatory Visit
Admission: RE | Admit: 2018-07-22 | Discharge: 2018-07-22 | Disposition: A | Discharge status: Home / Self Care | Payer: Self-pay | Source: Ambulatory Visit | Attending: Gastroenterology | Admitting: Gastroenterology

## 2018-07-22 ENCOUNTER — Other Ambulatory Visit: Payer: Self-pay

## 2018-07-22 ENCOUNTER — Other Ambulatory Visit: Payer: Self-pay | Admitting: Gastroenterology

## 2018-07-22 ENCOUNTER — Ambulatory Visit (INDEPENDENT_AMBULATORY_CARE_PROVIDER_SITE_OTHER)
Admission: RE | Admit: 2018-07-22 | Discharge: 2018-07-22 | Disposition: A | Discharge status: Home / Self Care | Payer: No Typology Code available for payment source | Source: Ambulatory Visit | Attending: Gastroenterology | Admitting: Gastroenterology

## 2018-07-22 DIAGNOSIS — R1032 Left lower quadrant pain: Secondary | ICD-10-CM | POA: Diagnosis not present

## 2018-07-22 DIAGNOSIS — R1013 Epigastric pain: Secondary | ICD-10-CM

## 2018-07-22 DIAGNOSIS — G8929 Other chronic pain: Secondary | ICD-10-CM

## 2018-07-22 DIAGNOSIS — K5732 Diverticulitis of large intestine without perforation or abscess without bleeding: Secondary | ICD-10-CM | POA: Diagnosis not present

## 2018-07-22 DIAGNOSIS — K5909 Other constipation: Secondary | ICD-10-CM

## 2018-07-22 MED ORDER — IOPAMIDOL (ISOVUE-300) INJECTION 61%
100.0000 mL | Freq: Once | INTRAVENOUS | Status: AC | PRN
  Administered 2018-07-22: 100 mL via INTRAVENOUS

## 2018-07-27 ENCOUNTER — Telehealth: Payer: Self-pay | Admitting: Gastroenterology

## 2018-07-27 NOTE — Telephone Encounter (Signed)
See result note.  

## 2018-07-27 NOTE — Telephone Encounter (Signed)
Pt is returning your call about the CT scan.

## 2018-08-15 ENCOUNTER — Telehealth: Payer: Self-pay | Admitting: Gastroenterology

## 2018-08-15 ENCOUNTER — Telehealth: Payer: Self-pay

## 2018-08-15 NOTE — Telephone Encounter (Signed)
In an effort to slow community spread of COVID-19 by decreasing the number of people coming to the office, I am reviewing charts of my upcoming clinic visits.  When appropriate, I am offering other options including, but are not limited to:  -  Telephone visits -  Rescheduling in-person clinic visits, timing of which to be determined by each patient's clinical scenario and required social distancing and/or quarantine recommendations from government authorities.   After an initial review of this patient's referral and pertinent chart information, I have decided:  Telephone visit with me 08/16/18 at 3:45 PM  Patient should be available by phone at that time and in a location where they can speak in privacy to protect their health information.

## 2018-08-15 NOTE — Telephone Encounter (Signed)
Spoke with pt and she is aware that Dr. Myrtie Neither will have a phone visit with her tomorrow at 3:45pm.

## 2018-08-16 ENCOUNTER — Other Ambulatory Visit: Payer: Self-pay

## 2018-08-16 ENCOUNTER — Telehealth (INDEPENDENT_AMBULATORY_CARE_PROVIDER_SITE_OTHER): Payer: No Typology Code available for payment source | Admitting: Gastroenterology

## 2018-08-16 DIAGNOSIS — R1032 Left lower quadrant pain: Secondary | ICD-10-CM

## 2018-08-16 DIAGNOSIS — K5909 Other constipation: Secondary | ICD-10-CM

## 2018-08-16 DIAGNOSIS — K5732 Diverticulitis of large intestine without perforation or abscess without bleeding: Secondary | ICD-10-CM

## 2018-08-16 NOTE — Telephone Encounter (Signed)
Pt aware of telephone visit and time.

## 2018-08-16 NOTE — Progress Notes (Signed)
This patient contacted our office requesting a physician telephone consultation regarding clinical questions and/or test results.  The patient and I were the only participants on the phone call, and the patient consented to phone consultation.  I was in my office and the patient was on her phone in her car.  Chief complaint: Left lower quadrant pain and diverticulitis    Relevant history and results:  Brenda Roberts saw me January 28 for office consult related to a recent protracted episode of diverticulitis with ongoing intermittent left lower quadrant pain and underlying constipation.  She has been improved lately, making a concerted effort to have a more regular toilet regimen, decrease caffeine and increase water and fiber intake.  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  A few days ago she had worsening left lower quadrant pain and constipation, finally relieved when she had several loose stools earlier today.  She did not have a fever or tenderness as before.  She has had no rectal bleeding, appetite is been normal and weight stable.  She had questions about the CT scan report from December during her hospitalization.  I did end up getting a copy of that report, made as an addendum to my initial office note.  I reviewed those findings and explained it to her.   Assessment and plan:   She is improved overall,, though still having some difficulty with constipation, which is multifactorial as noted in previous note.   There was no residual inflammation seen on CT scan from February 27.  She will make a concerted effort at the above-noted measures for chronic constipation, call me as needed.  I do not feel a colonoscopy is needed at this point.  Total encounter time: 12 minutes   Amada Jupiter, MD

## 2019-05-30 ENCOUNTER — Other Ambulatory Visit: Payer: Self-pay

## 2019-05-30 ENCOUNTER — Ambulatory Visit (INDEPENDENT_AMBULATORY_CARE_PROVIDER_SITE_OTHER): Payer: No Typology Code available for payment source | Admitting: Otolaryngology

## 2019-05-30 ENCOUNTER — Encounter (INDEPENDENT_AMBULATORY_CARE_PROVIDER_SITE_OTHER): Payer: Self-pay | Admitting: Otolaryngology

## 2019-05-30 VITALS — Temp 97.9°F

## 2019-05-30 DIAGNOSIS — J310 Chronic rhinitis: Secondary | ICD-10-CM

## 2019-05-30 DIAGNOSIS — H6521 Chronic serous otitis media, right ear: Secondary | ICD-10-CM | POA: Diagnosis not present

## 2019-05-30 NOTE — Progress Notes (Signed)
HPI: Brenda Roberts is a 39 y.o. female who returns today for evaluation of chronic right ear problems.  She had another right ear infection last week that is doing better presently but she still has a blocked right ear.  She is also had a lot of nasal sinus congestion more on the right side recently.  She is using the nasal steroid spray.  She presents today to discuss possible right M&T.Marland Kitchen  Past Medical History:  Diagnosis Date  . ADD (attention deficit disorder)   . Anxiety   . Arthritis   . Depression   . Diabetes mellitus    borderline  . Diverticulosis   . Endometriosis of pelvic peritoneum    Past Surgical History:  Procedure Laterality Date  . ABDOMINAL HYSTERECTOMY     with corrective surgery to follow  . CESAREAN SECTION  1998  . ECTOPIC PREGNANCY SURGERY     x 2  . URETHRAL SLING     Social History   Socioeconomic History  . Marital status: Married    Spouse name: Not on file  . Number of children: Not on file  . Years of education: Not on file  . Highest education level: Not on file  Occupational History  . Not on file  Tobacco Use  . Smoking status: Current Every Day Smoker    Packs/day: 1.00    Years: 10.00    Pack years: 10.00    Types: Cigarettes    Start date: 2010  . Smokeless tobacco: Never Used  Substance and Sexual Activity  . Alcohol use: No  . Drug use: No  . Sexual activity: Yes    Birth control/protection: Surgical  Other Topics Concern  . Not on file  Social History Narrative  . Not on file   Social Determinants of Health   Financial Resource Strain:   . Difficulty of Paying Living Expenses: Not on file  Food Insecurity:   . Worried About Programme researcher, broadcasting/film/video in the Last Year: Not on file  . Ran Out of Food in the Last Year: Not on file  Transportation Needs:   . Lack of Transportation (Medical): Not on file  . Lack of Transportation (Non-Medical): Not on file  Physical Activity:   . Days of Exercise per Week: Not on file  .  Minutes of Exercise per Session: Not on file  Stress:   . Feeling of Stress : Not on file  Social Connections:   . Frequency of Communication with Friends and Family: Not on file  . Frequency of Social Gatherings with Friends and Family: Not on file  . Attends Religious Services: Not on file  . Active Member of Clubs or Organizations: Not on file  . Attends Banker Meetings: Not on file  . Marital Status: Not on file   Family History  Problem Relation Age of Onset  . Prostate cancer Paternal Grandfather   . Lung cancer Paternal Grandfather   . Bone cancer Paternal Grandfather    Allergies  Allergen Reactions  . Dilaudid [Hydromorphone Hcl]     Migraines  . Sweet Potato     rash   Prior to Admission medications   Medication Sig Start Date End Date Taking? Authorizing Provider  amphetamine-dextroamphetamine (ADDERALL) 20 MG tablet Take 2 tablets by mouth every morning, 1 tablet by mouth every evening   Yes [provider]  Aspirin-Salicylamide-Caffeine (BC HEADACHE POWDER PO) Take 1 packet by mouth 4 (four) times daily as needed (pain).  Yes [provider]  butalbital-acetaminophen-caffeine (FIORICET WITH CODEINE) 50-325-40-30 MG per capsule Take 1 capsule by mouth every 4 (four) hours as needed for headache or migraine.   Yes [provider]  clonazePAM (KLONOPIN) 0.5 MG tablet Take 0.5 mg by mouth 2 (two) times daily as needed for anxiety.    Yes [provider]  HYDROcodone-acetaminophen (NORCO/VICODIN) 5-325 MG per tablet Take 1-2 tablets by mouth every 6 (six) hours as needed for moderate pain. Patient taking differently: Take 0.5 tablets by mouth every 6 (six) hours as needed for moderate pain.  05/27/14  Yes Vanetta Mulders, MD  ondansetron (ZOFRAN ODT) 4 MG disintegrating tablet Take 1 tablet (4 mg total) by mouth every 8 (eight) hours as needed for nausea or vomiting. 05/27/14  Yes Vanetta Mulders, MD  pantoprazole (PROTONIX) 40  MG tablet Take 40 mg by mouth daily.   Yes [provider]  promethazine (PHENERGAN) 25 MG tablet Take 25 mg by mouth every 6 (six) hours as needed for nausea or vomiting.   Yes [provider]  rizatriptan (MAXALT-MLT) 10 MG disintegrating tablet Take 10 mg by mouth every 2 (two) hours as needed for migraine. May repeat in 2 hours if needed   Yes [provider]     Positive ROS: Otherwise negative  All other systems have been reviewed and were otherwise negative with the exception of those mentioned in the HPI and as above.  Physical Exam: Constitutional: Alert, well-appearing, no acute distress Ears: External ears without lesions or tenderness.  Right TM is retracted with poor mobility consistent with otitis media with effusion.  Left TM is clear.  Right M&T was performed in the office today using phenol as a topical anesthetic.  Myringotomy was made in the anterior portion of the TM.  Minimal serous effusion was aspirated but the middle ear mucosa was very thickened.  A palpable type I tube was inserted followed by Floxin eardrops. Nasal: External nose without lesions. Septum relatively midline.  Severe right-sided nasal congestion..              Oral: Lips and gums without lesions. Tongue and palate mucosa without lesions. Posterior oropharynx clear. Neck: No palpable adenopathy or masses Respiratory: Breathing comfortably  Skin: No facial/neck lesions or rash noted.  Myringotomy  Date/Time: 05/30/2019 5:50 PM Performed by: Drema Halon, MD Authorized by: Drema Halon, MD   Consent:    Consent obtained:  Verbal   Consent given by:  Patient   Risks discussed:  Bleeding and pain   Alternatives discussed:  No treatment Pre-procedure details:    Indications: serous otitis media   Anesthesia:    Anesthesia method:  Topical application   Topical anesthetic:  Phenol Procedure Details:    Location:  Right TM   Hole made in:  Anteroinferior  aspect of TM   Tube:  Paparella Type 1 Findings:    Fluid:  Serous fluid Post-procedure details:    Patient tolerance of procedure:  Tolerated well, no immediate complications Comments:     Eardrops were insufflated to the middle ear space following placement of the Paparella type I tube.    Assessment: Right otitis media with effusion Chronic rhinitis  Plan: Right M&T was performed in the office today. Prescribed either Floxin otic or Ciprodex drops to use for the next 3 days 4 drops twice daily. She will follow-up in 2 weeks for recheck. Also prescribed Augmentin 875 mg twice daily for 10 days if she develops  any sinus symptoms prior to follow-up.   Radene Journey, MD

## 2019-06-14 ENCOUNTER — Encounter (INDEPENDENT_AMBULATORY_CARE_PROVIDER_SITE_OTHER): Payer: No Typology Code available for payment source | Admitting: Otolaryngology

## 2019-06-20 ENCOUNTER — Encounter (INDEPENDENT_AMBULATORY_CARE_PROVIDER_SITE_OTHER): Payer: No Typology Code available for payment source | Admitting: Otolaryngology

## 2020-02-21 ENCOUNTER — Ambulatory Visit (INDEPENDENT_AMBULATORY_CARE_PROVIDER_SITE_OTHER): Payer: No Typology Code available for payment source | Admitting: Otolaryngology

## 2020-03-21 ENCOUNTER — Ambulatory Visit (INDEPENDENT_AMBULATORY_CARE_PROVIDER_SITE_OTHER): Payer: No Typology Code available for payment source | Admitting: Otolaryngology

## 2020-04-02 ENCOUNTER — Encounter (INDEPENDENT_AMBULATORY_CARE_PROVIDER_SITE_OTHER): Payer: Self-pay | Admitting: Otolaryngology

## 2020-04-02 ENCOUNTER — Ambulatory Visit (INDEPENDENT_AMBULATORY_CARE_PROVIDER_SITE_OTHER): Payer: No Typology Code available for payment source | Admitting: Otolaryngology

## 2020-04-02 ENCOUNTER — Other Ambulatory Visit: Payer: Self-pay

## 2020-04-02 VITALS — Temp 97.7°F

## 2020-04-02 DIAGNOSIS — J310 Chronic rhinitis: Secondary | ICD-10-CM | POA: Diagnosis not present

## 2020-04-02 DIAGNOSIS — H6981 Other specified disorders of Eustachian tube, right ear: Secondary | ICD-10-CM | POA: Diagnosis not present

## 2020-04-02 NOTE — Progress Notes (Signed)
HPI: Brenda Roberts is a 39 y.o. female who returns today for evaluation of right ear complaints.  She feels like she has some water in the ear or draining from the ear.  She also occasionally hears her heartbeat.  She is status post placement of a right Paparella type I tube in January of this year.  She has had no drainage coming out of the ear..  Past Medical History:  Diagnosis Date  . ADD (attention deficit disorder)   . Anxiety   . Arthritis   . Depression   . Diabetes mellitus    borderline  . Diverticulosis   . Endometriosis of pelvic peritoneum    Past Surgical History:  Procedure Laterality Date  . ABDOMINAL HYSTERECTOMY     with corrective surgery to follow  . CESAREAN SECTION  1998  . ECTOPIC PREGNANCY SURGERY     x 2  . URETHRAL SLING     Social History   Socioeconomic History  . Marital status: Married    Spouse name: Not on file  . Number of children: Not on file  . Years of education: Not on file  . Highest education level: Not on file  Occupational History  . Not on file  Tobacco Use  . Smoking status: Current Every Day Smoker    Packs/day: 1.00    Years: 10.00    Pack years: 10.00    Types: Cigarettes    Start date: 2010  . Smokeless tobacco: Never Used  Substance and Sexual Activity  . Alcohol use: No  . Drug use: No  . Sexual activity: Yes    Birth control/protection: Surgical  Other Topics Concern  . Not on file  Social History Narrative  . Not on file   Social Determinants of Health   Financial Resource Strain:   . Difficulty of Paying Living Expenses: Not on file  Food Insecurity:   . Worried About Programme researcher, broadcasting/film/video in the Last Year: Not on file  . Ran Out of Food in the Last Year: Not on file  Transportation Needs:   . Lack of Transportation (Medical): Not on file  . Lack of Transportation (Non-Medical): Not on file  Physical Activity:   . Days of Exercise per Week: Not on file  . Minutes of Exercise per Session: Not on file   Stress:   . Feeling of Stress : Not on file  Social Connections:   . Frequency of Communication with Friends and Family: Not on file  . Frequency of Social Gatherings with Friends and Family: Not on file  . Attends Religious Services: Not on file  . Active Member of Clubs or Organizations: Not on file  . Attends Banker Meetings: Not on file  . Marital Status: Not on file   Family History  Problem Relation Age of Onset  . Prostate cancer Paternal Grandfather   . Lung cancer Paternal Grandfather   . Bone cancer Paternal Grandfather    Allergies  Allergen Reactions  . Dilaudid [Hydromorphone Hcl]     Migraines  . Sweet Potato     rash   Prior to Admission medications   Medication Sig Start Date End Date Taking? Authorizing Provider  amphetamine-dextroamphetamine (ADDERALL) 20 MG tablet Take 2 tablets by mouth every morning, 1 tablet by mouth every evening   Yes [provider]  Aspirin-Salicylamide-Caffeine (BC HEADACHE POWDER PO) Take 1 packet by mouth 4 (four) times daily as needed (pain).   Yes [provider]  butalbital-acetaminophen-caffeine (FIORICET WITH CODEINE) 50-325-40-30 MG per capsule Take 1 capsule by mouth every 4 (four) hours as needed for headache or migraine.   Yes [provider]  clonazePAM (KLONOPIN) 0.5 MG tablet Take 0.5 mg by mouth 2 (two) times daily as needed for anxiety.    Yes [provider]  HYDROcodone-acetaminophen (NORCO/VICODIN) 5-325 MG per tablet Take 1-2 tablets by mouth every 6 (six) hours as needed for moderate pain. Patient taking differently: Take 0.5 tablets by mouth every 6 (six) hours as needed for moderate pain.  05/27/14  Yes Vanetta Mulders, MD  ondansetron (ZOFRAN ODT) 4 MG disintegrating tablet Take 1 tablet (4 mg total) by mouth every 8 (eight) hours as needed for nausea or vomiting. 05/27/14  Yes Vanetta Mulders, MD  pantoprazole (PROTONIX) 40 MG tablet Take 40 mg by mouth daily.   Yes  [provider]  promethazine (PHENERGAN) 25 MG tablet Take 25 mg by mouth every 6 (six) hours as needed for nausea or vomiting.   Yes [provider]  rizatriptan (MAXALT-MLT) 10 MG disintegrating tablet Take 10 mg by mouth every 2 (two) hours as needed for migraine. May repeat in 2 hours if needed   Yes [provider]     Positive ROS: Otherwise negative  All other systems have been reviewed and were otherwise negative with the exception of those mentioned in the HPI and as above.  Physical Exam: Constitutional: Alert, well-appearing, no acute distress Ears: External ears without lesions or tenderness. Ear canals are clear bilaterally.  Left ear canal and left TM are clear.  Right TM reveals a patent Paparella type I tube with little bit of crusting around the base of the tube.  But the barrel of the tube can be easily visualized and is clear with a clear middle ear space.  The remaining TM is clear. Nasal: External nose without lesions. Septum slightly deviated to the right with moderate rhinitis..  Oral: Lips and gums without lesions. Tongue and palate mucosa without lesions. Posterior oropharynx clear. Neck: No palpable adenopathy or masses Respiratory: Breathing comfortably  Skin: No facial/neck lesions or rash noted.  Procedures  Assessment: Patent right Paparella type I tube Chronic rhinitis  Plan: Recommended regular use of the Nasacort 2 sprays each nostril at night and prescribed this.  She probably has some eustachian tube problems secondary to the rhinitis.  No abnormalities noted in the right ear except for a patent palpable type I tube with some crusting and scabbing around the base of the tube. If symptoms persist could consider removal of the tube. She will follow-up as needed.   Narda Bonds, MD

## 2020-05-18 IMAGING — CT CT ABD-PELV W/ CM
2 of 4 series · 16 of 46 positions shown, 18 images · IV contrast (ISOVUE 300)
Comparison: 04/29/2018 from [REDACTED]

CLINICAL DATA: Left lower quadrant pain and chronic constipation.
Follow-up diverticulitis.

EXAM:
CT ABDOMEN AND PELVIS WITH CONTRAST
TECHNIQUE: Multidetector CT imaging of the abdomen and pelvis was performed
using the standard protocol following bolus administration of
intravenous contrast.
CONTRAST:  100mL ZZQHVX-[id number] IOPAMIDOL (ZZQHVX-TZZ) INJECTION 61%

[Series 3: abd/pel w · axial · 0.67mm/px · z∈[-506,-101]mm · 13 of 91 slices shown, 15 images]
[im 5/91  soft-tissue]
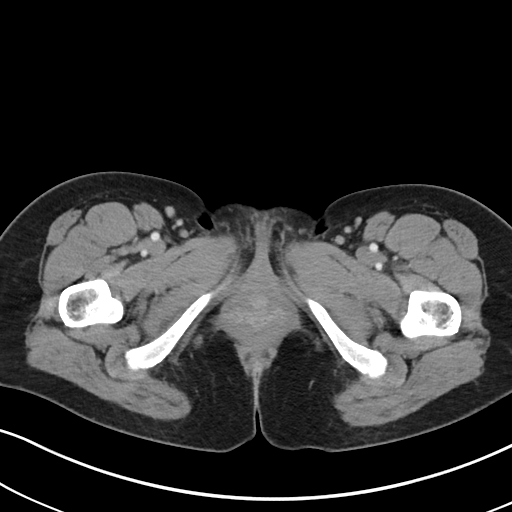
[im 5/91  bone]
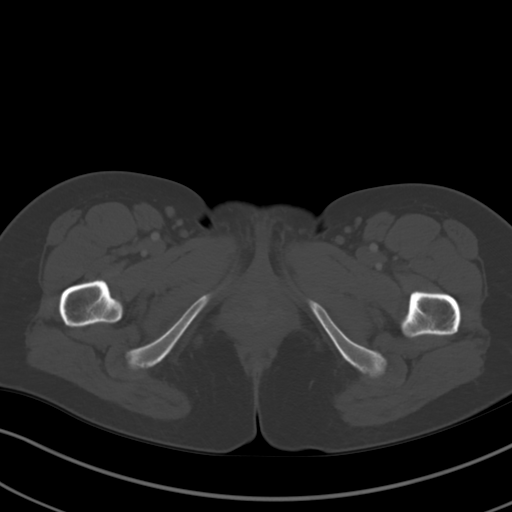
[im 13/91  soft-tissue]
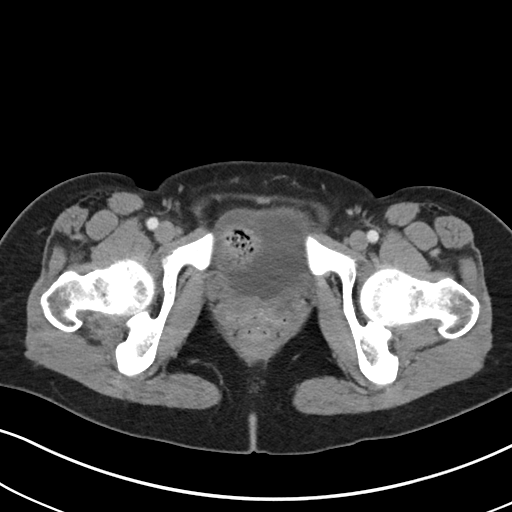
[im 18/91  soft-tissue]
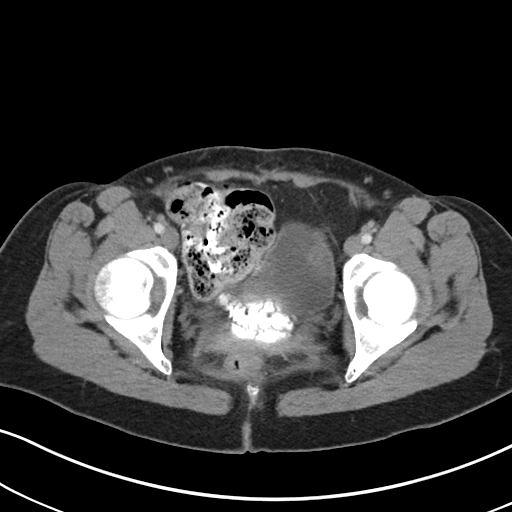
[im 26/91  soft-tissue]
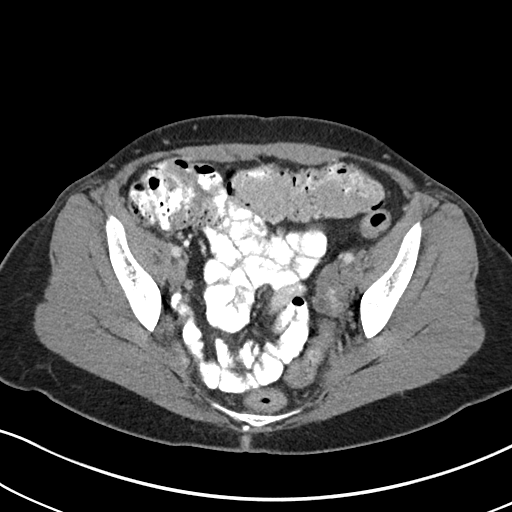
[im 31/91  soft-tissue]
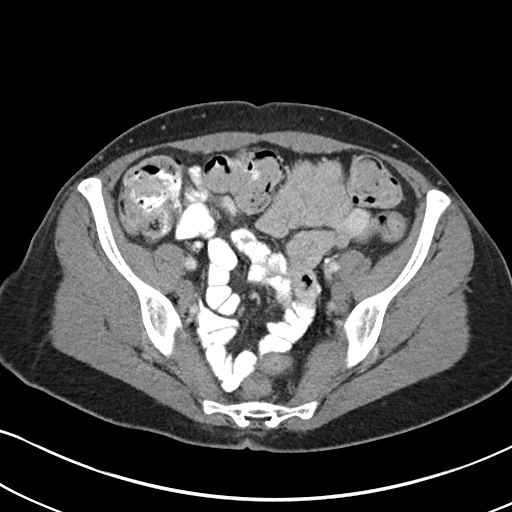
[im 39/91  soft-tissue]
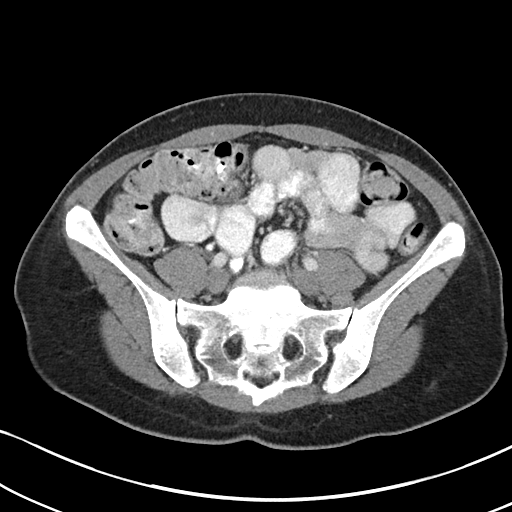
[im 48/91  soft-tissue]
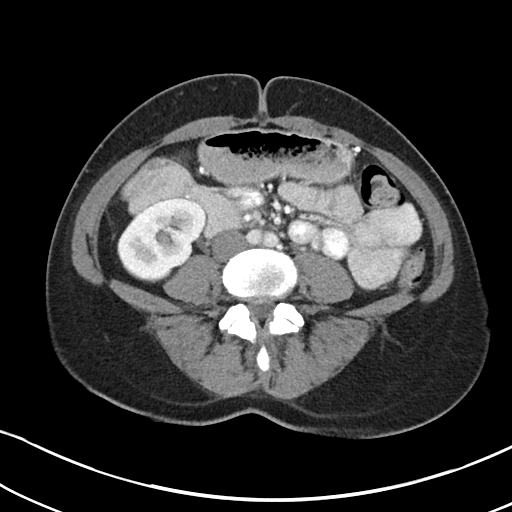
[im 52/91  soft-tissue]
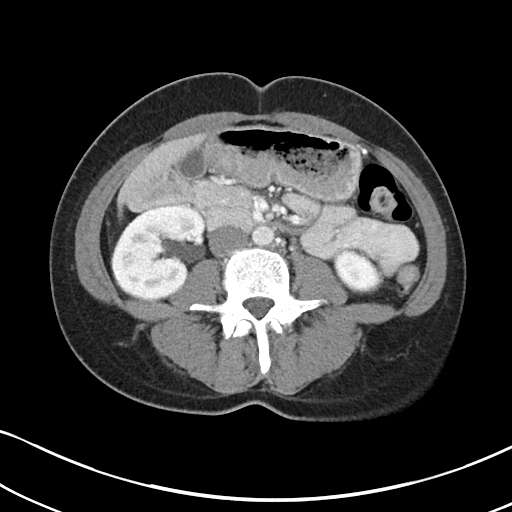
[im 61/91  soft-tissue]
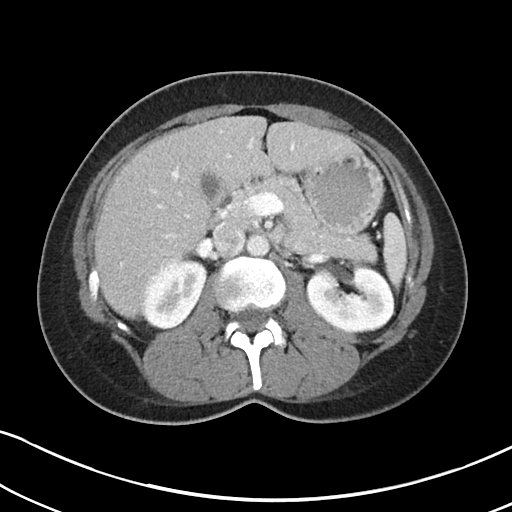
[im 61/91  bone]
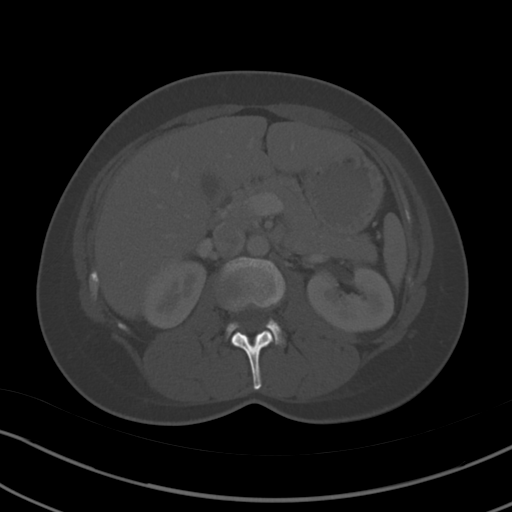
[im 65/91  soft-tissue]
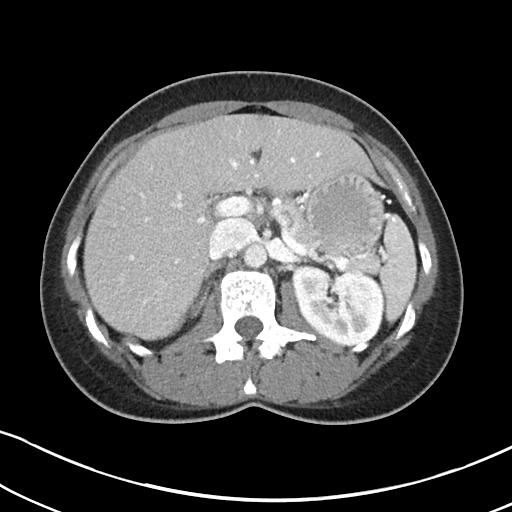
[im 73/91  soft-tissue]
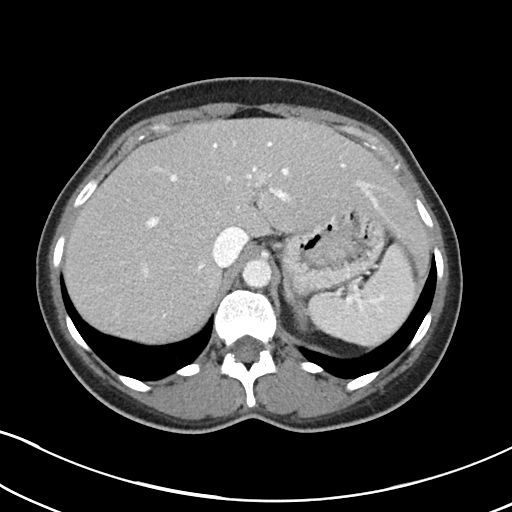
[im 78/91  soft-tissue]
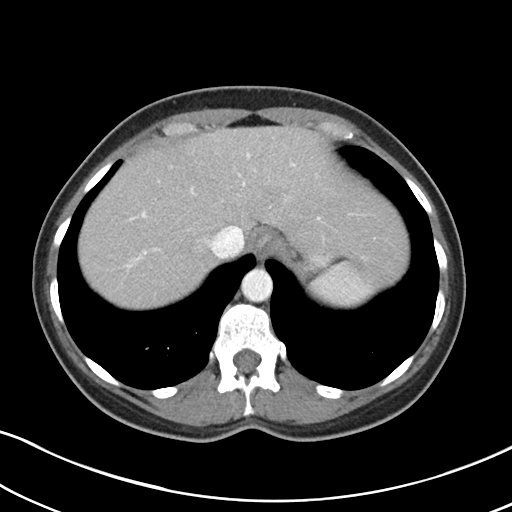
[im 86/91  soft-tissue]
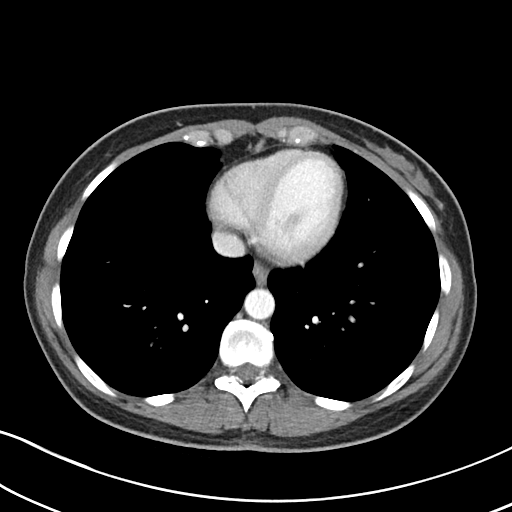

[Series 5: abd/pel w st · coronal · 0.65mm/px · 3 of 81 slices shown]
[im 27/81  soft-tissue]
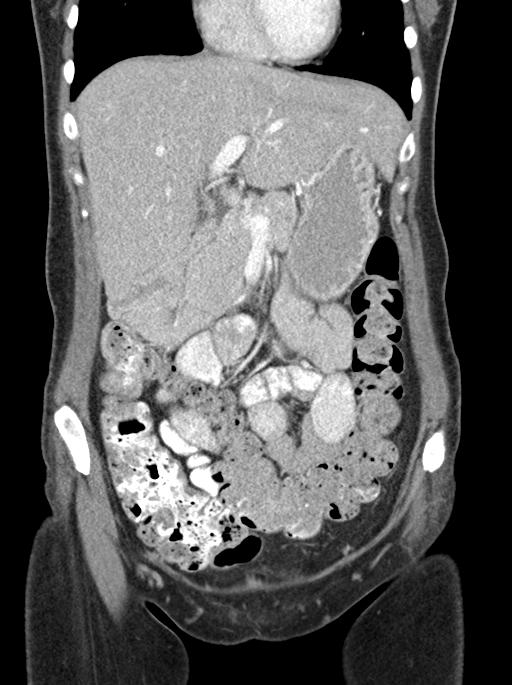
[im 36/81  soft-tissue]
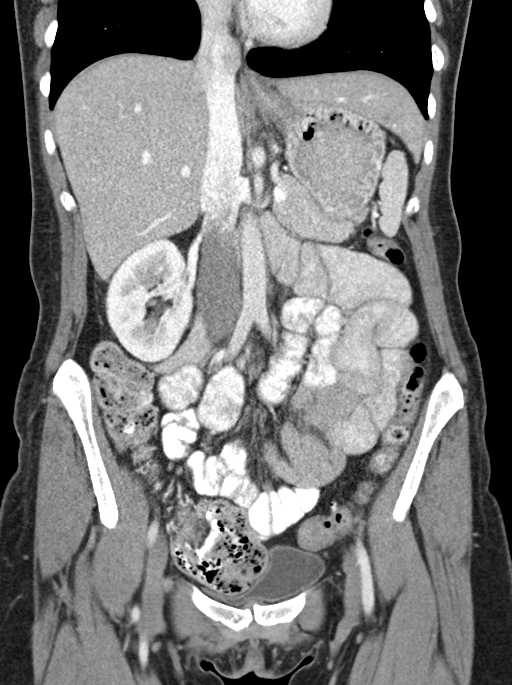
[im 45/81  soft-tissue]
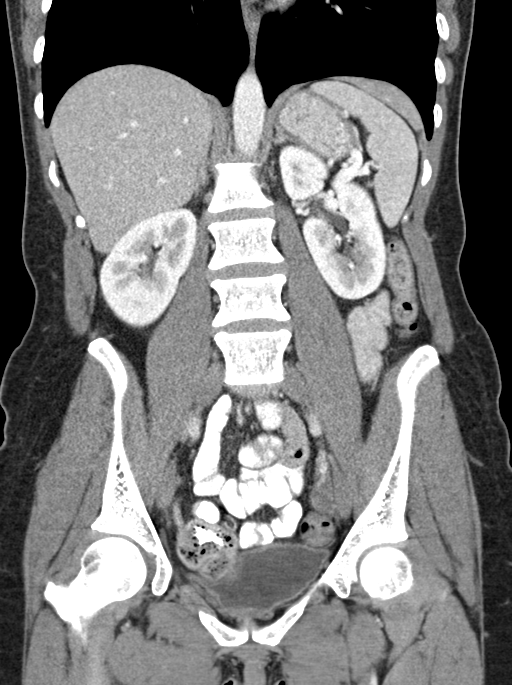

[16 of 46 positions shown; findings below may reference images not displayed]

FINDINGS: Lower Chest: No acute findings.

Hepatobiliary: No hepatic masses identified. Gallbladder is
unremarkable.

Pancreas:  No mass or inflammatory changes.

Spleen: Within normal limits in size and appearance.

Adrenals/Urinary Tract: No masses identified. No evidence of
hydronephrosis.

Stomach/Bowel: No evidence of obstruction, inflammatory process or
abnormal fluid collections. Normal appendix visualized. Mild
diverticulosis is seen mainly involving the descending and sigmoid
colon, however there is no evidence of diverticulitis.

Vascular/Lymphatic: No pathologically enlarged lymph nodes. No
abdominal aortic aneurysm.

Reproductive: Prior hysterectomy noted. Adnexal regions are
unremarkable in appearance.

Other:  None.

Musculoskeletal:  No suspicious bone lesions identified.
IMPRESSION: Mild colonic diverticulosis, without radiographic evidence of
diverticulitis or other significant abnormality.
